# Patient Record
Sex: Male | Born: 1960 | Race: White | Hispanic: No | Smoking: Current some day smoker
Health system: Southern US, Community
[De-identification: ages and names within clinical notes are randomized; demographics above are authoritative.]

## PROBLEM LIST (undated history)

## (undated) DIAGNOSIS — M1990 Unspecified osteoarthritis, unspecified site: Secondary | ICD-10-CM

## (undated) HISTORY — DX: Unspecified osteoarthritis, unspecified site: M19.90

## (undated) HISTORY — PX: EYE SURGERY: SHX253

---

## 2002-04-30 ENCOUNTER — Emergency Department (HOSPITAL_COMMUNITY): Admission: EM | Admit: 2002-04-30 | Discharge: 2002-05-01 | Payer: Self-pay | Admitting: Emergency Medicine

## 2002-04-30 ENCOUNTER — Encounter: Payer: Self-pay | Admitting: Emergency Medicine

## 2004-10-28 ENCOUNTER — Ambulatory Visit (HOSPITAL_COMMUNITY): Admission: RE | Admit: 2004-10-28 | Discharge: 2004-10-28 | Payer: Self-pay | Admitting: Gastroenterology

## 2008-09-11 ENCOUNTER — Emergency Department (HOSPITAL_COMMUNITY): Admission: EM | Admit: 2008-09-11 | Discharge: 2008-09-11 | Payer: Self-pay | Admitting: Emergency Medicine

## 2010-07-10 NOTE — Op Note (Signed)
NAME:  [name] [name]                ACCOUNT NO.:  [id number]   MEDICAL RECORD NO.:  [id number]          PATIENT TYPE:  AMB   LOCATION:  ENDO                         FACILITY:  [facility]   PHYSICIAN:  James L. Malon Kindle., M.D.DATE OF BIRTH:  29-Nov-1960   DATE OF PROCEDURE:  10/28/2004  DATE OF DISCHARGE:                                 OPERATIVE REPORT   PROCEDURE:  Colonoscopy.   MEDICATIONS:  Fentanyl 125 mcg, Versed __________ throughout the procedure.   SCOPE:  Pediatric adjustable scope.   INDICATIONS:  Heme-positive stool.   DESCRIPTION OF PROCEDURE:  The procedure had been explained to the patient  and consent obtained.  In the left lateral decubitus position digital exam  was performed and the pediatric adjustable scope was inserted.  The scope  was then advanced.  The prep was adequate, some areas were obscured by  sticky, adherent stool but we were able to reach the cecum where the  ileocecal valve and appendiceal orifice were seen.  The scope was withdrawn  and the colon carefully examined upon withdrawal.  The mucosal pattern was  normal throughout with no ulceration, inflammation or evidence of any active  colitis.  There was no diverticular disease and no polyps or other  significant lesions.  The scope was withdrawn in the rectum and the rectum  was irrigated vigorously and was free of any obvious lesions.  The scope was  withdrawn and the patient tolerated the procedure well.   ASSESSMENT:  Heme-positive stool, negative colonoscopy, __________ .   PLAN:  We have the patient avoid nonsteroidal drugs and follow up with Dr.  Toma Aran of [facility].           ______________________________  Llana Aliment. Malon Kindle., M.D.     [name]  D:  10/28/2004  T:  10/28/2004  Job:  [id number]   cc:   Jari Pigg, M.D.

## 2010-07-10 NOTE — Op Note (Signed)
NAME:  [name] [name], [name] [name]                ACCOUNT NO.:  [id number]   MEDICAL RECORD NO.:  [id number]          PATIENT TYPE:  AMB   LOCATION:  ENDO                         FACILITY:  [facility]   PHYSICIAN:  James L. Malon Kindle., M.D.DATE OF BIRTH:  07-Apr-1960   DATE OF PROCEDURE:  10/28/2004  DATE OF DISCHARGE:                                 OPERATIVE REPORT   PROCEDURE PERFORMED:  Esophagogastroduodenoscopy.   MEDICATIONS:  Fentanyl 100 mcg, Versed 10 mg IV.   ENDOSCOPIST:  Llana Aliment. Randa Evens, M.D.   INDICATIONS FOR PROCEDURE:  Heme positive stool.   DESCRIPTION OF PROCEDURE:  The procedure had been explained to the patient  and consent obtained.  With the patient in left lateral decubitus position,  the Olympus scope was inserted and advanced with agglutination.  The  esophagus was entered.  The stomach was entered.  The pylorus identified and  passed.  The duodenum including the bulb and second portion were seen well  and were unremarkable.  The scope was withdrawn back into the stomach.  The  pyloric channel, antrum and body were seen well and were normal.  Fundus and  cardia were seen well on the retroflex view and were normal.  The distal and  proximal esophagus were endoscopically normal.  The scope was withdrawn.  The patient tolerated the procedure well.   ASSESSMENT:  Heme positive stool with negative endoscopy, 719.1.   PLAN:  Will proceed at this time with colonoscopy.           ______________________________  Llana Aliment. Malon Kindle., M.D.     [name]  D:  10/28/2004  T:  10/28/2004  Job:  [id number]   cc:   [name]

## 2013-10-02 ENCOUNTER — Other Ambulatory Visit: Payer: Self-pay | Admitting: Thoracic Surgery (Cardiothoracic Vascular Surgery)

## 2013-10-02 ENCOUNTER — Encounter: Payer: Self-pay | Admitting: Thoracic Surgery (Cardiothoracic Vascular Surgery)

## 2013-10-02 ENCOUNTER — Institutional Professional Consult (permissible substitution) (INDEPENDENT_AMBULATORY_CARE_PROVIDER_SITE_OTHER): Payer: PRIVATE HEALTH INSURANCE | Admitting: Thoracic Surgery (Cardiothoracic Vascular Surgery)

## 2013-10-02 ENCOUNTER — Ambulatory Visit
Admission: RE | Admit: 2013-10-02 | Discharge: 2013-10-02 | Disposition: A | Discharge status: Home / Self Care | Payer: PRIVATE HEALTH INSURANCE | Source: Ambulatory Visit | Attending: Thoracic Surgery (Cardiothoracic Vascular Surgery) | Admitting: Thoracic Surgery (Cardiothoracic Vascular Surgery)

## 2013-10-02 VITALS — BP 113/76 | HR 74 | Resp 16 | Ht 71.5 in | Wt 201.0 lb

## 2013-10-02 DIAGNOSIS — S2231XA Fracture of one rib, right side, initial encounter for closed fracture: Secondary | ICD-10-CM

## 2013-10-02 DIAGNOSIS — J90 Pleural effusion, not elsewhere classified: Secondary | ICD-10-CM

## 2013-10-02 DIAGNOSIS — S2239XA Fracture of one rib, unspecified side, initial encounter for closed fracture: Secondary | ICD-10-CM

## 2013-10-02 NOTE — Progress Notes (Signed)
PCP is No primary provider on file. Referring Provider is No ref. provider found  Chief Complaint  Patient presents with  . NEW THORACIC      NP/CLOSED MULTIPLE RIGHT RIB FX AFTER TRAUMA...fell 5 feet hitting right side of his chest on a pipe...happened 09/29/13    HPI: 53 yo man for consultation regarding right rib fractures.  [name] [name] is a 53 year old gentleman who fell off proximally 5 feet onto a metal pipe on Thursday (09/27/2013). He had pain in his right rib cage and difficulty taking a deep breath. He was seen in urgent care in [place] and told he had a rib fracture. He was given prescriptions for hydrocodone and Flexeril.  He followed up with an urgent care here. A repeat chest x-ray today shows a right posterior 8th rib fracture. There are small lateral off fractures in the seventh eighth and ninth ribs on the right. There is a small right pleural effusion. There is no pneumothorax   Past Medical History  Diagnosis Date  . Arthritis     NECK AND SHOULDERS    Past Surgical History  Procedure Laterality Date  . Eye surgery      53 YRS OLD    Family History  Problem Relation Age of Onset  . Cancer Father     LUNG    Social History History  Substance Use Topics  . Smoking status: Current Every Day Smoker -- 1.00 packs/day for 20 years    Types: Cigarettes  . Smokeless tobacco: Current User    Types: Snuff  . Alcohol Use: Yes     Comment: COUPLE OF BEERS A WEEK    Current Outpatient Prescriptions  Medication Sig Dispense Refill  . cyclobenzaprine (FLEXERIL) 10 MG tablet Take 10 mg by mouth 3 (three) times daily as needed for muscle spasms.      Marland Kitchen HYDROcodone-acetaminophen (NORCO) 7.5-325 MG per tablet Take 1 tablet by mouth every 6 (six) hours as needed for moderate pain.       No current facility-administered medications for this visit.    No Known Allergies  Review of Systems  Constitutional: Positive for unexpected weight change (has lost 50 pounds  intentionally).  Respiratory: Positive for cough.        Right chest wall pain  All other systems reviewed and are negative.   BP 113/76  Pulse 74  Resp 16  Ht 5' 11.5" (1.816 m)  Wt 201 lb (91.173 kg)  BMI 27.65 kg/m2  SpO2 97% Physical Exam  Vitals reviewed. Constitutional: He is oriented to person, place, and time. He appears well-developed and well-nourished. He appears distressed (obvious discomfort with movement).  HENT:  Head: Normocephalic and atraumatic.  Eyes: EOM are normal. Pupils are equal, round, and reactive to light.  Neck: Neck supple. No tracheal deviation present.  Cardiovascular: Normal rate, regular rhythm and normal heart sounds.   Pulmonary/Chest:  Ecchymosis and Bord abrasions right flank Decreased BS at right base  Abdominal: Soft. There is no tenderness.  Neurological: He is alert and oriented to person, place, and time. No cranial nerve deficit.  Skin: Skin is warm and dry.     Diagnostic Tests: Chest x-ray 10/02/2013 Posterior fracture right eighth rib, lateral fractures right seventh, eighth and ninth ribs. Small right pleural effusion  Impression: 53 year old gentleman who suffered a fall into his right chest. He has multiple rib fractures. He is experiencing great deal of pain with this as is typically the case. He is getting some relief  with Norco and Flexeril. The fractures are nondisplaced and he does not have a flail chest. He does have a very small hemothorax. That does not warrant intervention.  I explained to him that rest and pain control of the mainstays of treatment. He may try using an ice pack to the side as well as that may help some. I also advised him that nonsteroidal anti-inflammatories we'll likely work better in the long-term although I do think that he will need to continue to use the Norco for now.  He was advised to avoid any heavy lifting for a least a month. He was also advised he may continue to have pain in that area for  several months after that there  Plan: I will be happy see [name] [name] back if I can be of any further assistance with his care.

## 2015-06-04 ENCOUNTER — Encounter (HOSPITAL_COMMUNITY): Payer: Self-pay | Admitting: *Deleted

## 2015-06-04 ENCOUNTER — Emergency Department (HOSPITAL_COMMUNITY): Payer: PRIVATE HEALTH INSURANCE

## 2015-06-04 ENCOUNTER — Emergency Department (HOSPITAL_COMMUNITY)
Admission: EM | Admit: 2015-06-04 | Discharge: 2015-06-04 | Disposition: A | Discharge status: Home / Self Care | Payer: PRIVATE HEALTH INSURANCE | Attending: Emergency Medicine | Admitting: Emergency Medicine

## 2015-06-04 DIAGNOSIS — M7042 Prepatellar bursitis, left knee: Secondary | ICD-10-CM

## 2015-06-04 DIAGNOSIS — Y9389 Activity, other specified: Secondary | ICD-10-CM | POA: Insufficient documentation

## 2015-06-04 DIAGNOSIS — M25512 Pain in left shoulder: Secondary | ICD-10-CM | POA: Diagnosis not present

## 2015-06-04 DIAGNOSIS — M25462 Effusion, left knee: Secondary | ICD-10-CM | POA: Diagnosis present

## 2015-06-04 DIAGNOSIS — M1990 Unspecified osteoarthritis, unspecified site: Secondary | ICD-10-CM | POA: Diagnosis not present

## 2015-06-04 DIAGNOSIS — F17210 Nicotine dependence, cigarettes, uncomplicated: Secondary | ICD-10-CM | POA: Insufficient documentation

## 2015-06-04 MED ORDER — HYDROCODONE-ACETAMINOPHEN 5-325 MG PO TABS
1.0000 | ORAL_TABLET | Freq: Once | ORAL | Status: AC
  Administered 2015-06-04: 1 via ORAL
  Filled 2015-06-04: qty 1

## 2015-06-04 MED ORDER — NAPROXEN 500 MG PO TABS: 500.0000 mg | ORAL_TABLET | Freq: Two times a day (BID) | ORAL | Status: DC

## 2015-06-04 NOTE — ED Notes (Signed)
PT reports knee swelling to LT side has been present for 1 month and LT shoulder pain started on yesterday. 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

## 2015-06-04 NOTE — ED Notes (Signed)
Declined W/C at D/C and was escorted to lobby by RN. 

## 2015-06-04 NOTE — Discharge Instructions (Signed)
Prepatellar Bursitis With Rehab  Bursitis is a condition that is characterized by inflammation of a bursa. Saunders Revel exists in many areas of the body. They are fluid-filled sacs that lie between a soft tissue (skin, tendon, or ligament) and a bone, and they reduce friction between the structures as well as the stress placed on the soft tissue. Prepatellar bursitis is inflammation of the bursa that lies between the skin and the kneecap (patella). This condition often causes pain over the patella. SYMPTOMS   Pain, tenderness, and/or inflammation over the patella.  Pain that worsens with movement of the knee joint.  Decreased range of motion for the knee joint.  A crackling sound (crepitation) when the bursa is moved or touched.  Occasionally, painless swelling of the bursa.  Fever (when infected). CAUSES  Bursitis is caused by damage to the bursa, which results in an inflammatory response. Common mechanisms of injury include:  Direct trauma to the front of the knee.  Repetitive and/or stressful use of the knee. RISK INCREASES WITH:  Activities in which kneeling and/or falling on one's knees is likely (volleyball or football).  Repetitive and stressful training, especially if it involves running on hills.  Improper training techniques, such as a sudden increase in the intensity, frequency, or duration of training.  Failure to warm up properly before activity.  Poor technique.  Artificial turf. PREVENTION   Avoid kneeling or falling on your knees.  Warm up and stretch properly before activity.  Allow for adequate recovery between workouts.  Maintain physical fitness:  Strength, flexibility, and endurance.  Cardiovascular fitness.  Learn and use proper technique. When possible, have a coach correct improper technique.  Wear properly fitted and padded protective equipment (knee pads). PROGNOSIS  If treated properly, then the symptoms of prepatellar bursitis usually resolve  within 2 weeks. RELATED COMPLICATIONS   Recurrent symptoms that result in a chronic problem.  Prolonged healing time, if improperly treated or reinjured.  Limited range of motion.  Infection of bursa.  Chronic inflammation or scarring of bursa. TREATMENT  Treatment initially involves the use of ice and medication to help reduce pain and inflammation. The use of strengthening and stretching exercises may help reduce pain with activity, especially those of the quadriceps and hamstring muscles. These exercises may be performed at home or with referral to a therapist. Your caregiver may recommend knee pads when you return to playing sports, in order to reduce the stress on the prepatellar bursa. If symptoms persist despite treatment, then your caregiver may drain fluid out with a needle (aspirate) the bursa. If symptoms persist for greater than 6 months despite nonsurgical (conservative) treatment, then surgery may be recommended to remove the bursa.  MEDICATION  If pain medication is necessary, then nonsteroidal anti-inflammatory medications, such as aspirin and ibuprofen, or other Konczal pain relievers, such as acetaminophen, are often recommended.  Do not take pain medication for 7 days before surgery.  Prescription pain relievers may be given if deemed necessary by your caregiver. Use only as directed and only as much as you need.  Corticosteroid injections may be given by your caregiver. These injections should be reserved for the most serious cases, because they may only be given a certain number of times. HEAT AND COLD  Cold treatment (icing) relieves pain and reduces inflammation. Cold treatment should be applied for 10 to 15 minutes every 2 to 3 hours for inflammation and pain and immediately after any activity that aggravates your symptoms. Use ice packs or massage the  area with a piece of ice (ice massage). °· Heat treatment may be used prior to performing the stretching and  strengthening activities prescribed by your caregiver, physical therapist, or athletic trainer. Use a heat pack or soak the injury in warm water. °SEEK MEDICAL CARE IF: °· Treatment seems to offer no benefit, or the condition worsens. °· Any medications produce adverse side effects. °EXERCISES °RANGE OF MOTION (ROM) AND STRETCHING EXERCISES - Prepatellar Bursitis °These exercises may help you when beginning to rehabilitate your injury. Your symptoms may resolve with or without further involvement from your physician, physical therapist or athletic trainer. While completing these exercises, remember:  °· Restoring tissue flexibility helps normal motion to return to the joints. This allows healthier, less painful movement and activity. °· An effective stretch should be held for at least 30 seconds. °· A stretch should never be painful. You should only feel a gentle lengthening or release in the stretched tissue. °STRETCH - Hamstrings, Standing °· Stand or sit and extend your right / left leg, placing your foot on a chair or foot stool °· Keeping a slight arch in your low back and your hips straight forward. °· Lead with your chest and lean forward at the waist until you feel a gentle stretch in the back of your right / left knee or thigh. (When done correctly, this exercise requires leaning only a small distance.) °· Hold this position for __________ seconds. °Repeat __________ times. Complete this stretch __________ times per day. °STRETCH - Quadriceps, Prone  °· Lie on your stomach on a firm surface, such as a bed or padded floor. °· Bend your right / left knee and grasp your ankle. If you are unable to reach, your ankle or pant leg, use a belt around your foot to lengthen your reach. °· Gently pull your heel toward your buttocks. Your knee should not slide out to the side. You should feel a stretch in the front of your thigh and/or knee. °· Hold this position for __________ seconds. °Repeat __________ times.  Complete this stretch __________ times per day.  °STRETCH - Hamstrings/Adductors, V-Sit  °· Sit on the floor with your legs extended in a large "V," keeping your knees straight. °· With your head and chest upright, bend at your waist reaching for your right foot to stretch your left adductors. °· You should feel a stretch in your left inner thigh. Hold for __________ seconds. °· Return to the upright position to relax your leg muscles. °· Continuing to keep your chest upright, bend straight forward at your waist to stretch your hamstrings. °· You should feel a stretch behind both of your thighs and/or knees. Hold for __________ seconds. °· Return to the upright position to relax your leg muscles. °· Repeat steps 2 through 4. °Repeat __________ times. Complete this exercise __________ times per day.  °STRENGTHENING EXERCISES - Prepatellar Bursitis °· These exercises may help you when beginning to rehabilitate your injury. They may resolve your symptoms with or without further involvement from your physician, physical therapist or athletic trainer. While completing these exercises, remember: °· Muscles can gain both the endurance and the strength needed for everyday activities through controlled exercises. °· Complete these exercises as instructed by your physician, physical therapist or athletic trainer. Progress the resistance and repetitions only as guided. °STRENGTH - Quadriceps, Isometrics °· Lie on your back with your right / left leg extended and your opposite knee bent. °· Gradually tense the muscles in the front of your right /   left thigh. You should see either your kneecap slide up toward your hip or increased dimpling just above the knee. This motion will push the back of the knee down toward the floor/mat/bed on which you are lying.  Hold the muscle as tight as you can without increasing your pain for __________ seconds.  Relax the muscles slowly and completely in between each repetition. Repeat  __________ times. Complete this exercise __________ times per day.  STRENGTH - Quadriceps, Short Arcs   Lie on your back. Place a __________ inch towel roll under your knee so that the knee slightly bends.  Raise only your lower leg by tightening the muscles in the front of your thigh. Do not allow your thigh to rise.  Hold this position for __________ seconds. Repeat __________ times. Complete this exercise __________ times per day.  OPTIONAL ANKLE WEIGHTS: Begin with ____________________, but DO NOT exceed ____________________. Increase in1 lb/0.5 kg increments.  STRENGTH - Quadriceps, Straight Leg Raises  Quality counts! Watch for signs that the quadriceps muscle is working to insure you are strengthening the correct muscles and not "cheating" by substituting with healthier muscles.  Lay on your back with your right / left leg extended and your opposite knee bent.  Tense the muscles in the front of your right / left thigh. You should see either your kneecap slide up or increased dimpling just above the knee. Your thigh may even quiver.  Tighten these muscles even more and raise your leg 4 to 6 inches off the floor. Hold for __________ seconds.  Keeping these muscles tense, lower your leg.  Relax the muscles slowly and completely in between each repetition. Repeat __________ times. Complete this exercise __________ times per day.  STRENGTH - Quadriceps, Step-Ups   Use a thick book, step or step stool that is __________ inches tall.  Holding a wall or counter for balance only, not support.  Slowly step-up with your right / left foot, keeping your knee in line with your hip and foot. Do not allow your knee to bend so far that you cannot see your toes.  Slowly unlock your knee and lower yourself to the starting position. Your muscles, not gravity, should lower you. Shoulder Pain The shoulder is the joint that connects your arm to your body. Muscles and band-like tissues that connect  bones to muscles (tendons) hold the joint together. Shoulder pain is felt if an injury or medical problem affects one or more parts of the shoulder. HOME CARE  Put ice on the sore area. Put ice in a plastic bag. Place a towel between your skin and the bag. Leave the ice on for 15-20 minutes, 03-04 times a day for the first 2 days. Stop using cold packs if they do not help with the pain. If you were given something to keep your shoulder from moving (sling; shoulder immobilizer), wear it as told. Only take it off to shower or bathe. Move your arm as little as possible, but keep your hand moving to prevent puffiness (swelling). Squeeze a soft ball or foam pad as much as possible to help prevent swelling. Take medicine as told by your doctor. GET HELP IF: You have progressing new pain in your arm, hand, or fingers. Your hand or fingers get cold. Your medicine does not help lessen your pain. GET HELP RIGHT AWAY IF:  Your arm, hand, or fingers are numb or tingling. Your arm, hand, or fingers are puffy (swollen), painful, or turn white or blue. MAKE  SURE YOU:  Understand these instructions. Will watch your condition. Will get help right away if you are not doing well or get worse.   This information is not intended to replace advice given to you by your health care provider. Make sure you discuss any questions you have with your health care provider.   Follow-up with orthopedic provider if your symptoms do not improve. Avoid repeat trauma to your knee. Wear knee brace or kneepads while at work. The swelling in your knee should improve over the next month. If for any reason your knee becomes red, swollen or hot to the touch and you have pain with knee movement you'll need to return to the ER. Take Naprosyn for pain and inflammation. Keep left arm in sling as much as possible. Apply ice to affected area.

## 2015-06-04 NOTE — ED Provider Notes (Signed)
CSN: [id number]     Arrival date & time 06/04/15  1116 History  By signing my name below, I, Emmanuella Mensah, attest that this documentation has been prepared under the direction and in the presence of AvayaSamantha Tudor Chandley, PA-C. Electronically Signed: Angelene GiovanniEmmanuella Mensah, ED Scribe. 06/04/2015. 12:55 PM.      Chief Complaint  Patient presents with  . Joint Swelling  . Shoulder Pain   The history is provided by the patient. No language interpreter was used.   HPI Comments: [name] [name] is a 55 y.o. male who presents to the Emergency Department complaining of gradually worsening moderate pain and swelling to the left knee onset 2 weeks ago.  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 He also reports left shoulder pain onset several days ago. He reports pain with ROM of the left shoulder. He denies any recent falls, injuries, or trauma but states that he was playing water guns with his grandsons yesterday which increased his pain. He states that he tried Ibuprofen with no relief. He denies any fever, chills, or open wounds.    Past Medical History  Diagnosis Date  . Arthritis     NECK AND SHOULDERS   Past Surgical History  Procedure Laterality Date  . Eye surgery      55 YRS OLD   Family History  Problem Relation Age of Onset  . Cancer Father     LUNG   Social History  Substance Use Topics  . Smoking status: Current Every Day Smoker -- 1.00 packs/day for 20 years    Types: Cigarettes  . Smokeless tobacco: Current User    Types: Snuff  . Alcohol Use: Yes     Comment: COUPLE OF BEERS A WEEK    Review of Systems  Constitutional: Negative for fever and chills.  Musculoskeletal: Positive for joint swelling and arthralgias (left knee and left shoulder).  Skin: Negative for rash and wound.      Allergies  Review of patient's allergies indicates no known allergies.  Home Medications   Prior to Admission medications    Medication Sig Start Date End Date Taking? Authorizing Provider  cyclobenzaprine (FLEXERIL) 10 MG tablet Take 10 mg by mouth 3 (three) times daily as needed for muscle spasms.    Historical Provider, MD  HYDROcodone-acetaminophen (NORCO) 7.5-325 MG per tablet Take 1 tablet by mouth every 6 (six) hours as needed for moderate pain.    Historical Provider, MD   BP 133/91 mmHg  Pulse 81  Temp(Src) 98.1 F (36.7 C) (Oral)  Resp 20  SpO2 96% Physical Exam  Constitutional: He is oriented to person, place, and time. He appears well-developed and well-nourished. No distress.  HENT:  Head: Normocephalic and atraumatic.  Eyes: Conjunctivae are normal. Right eye exhibits no discharge. Left eye exhibits no discharge. No scleral icterus.  Cardiovascular: Normal rate.   Pulmonary/Chest: Effort normal.  Musculoskeletal:  Large fluid collection on anterior aspect of left patella, no overlying erythema, no pain with flexion or extension. Popliteal pulses present Negative posterior and anterior drawer  Shoulder: TTP over AC joint, positive neer test. Pain with abduction and limited ROM due to pain. No obvious bony deformities.   Neurological: He is alert and oriented to person, place, and time. Coordination normal.  Skin: Skin is warm and dry. No rash noted. He is not diaphoretic. No erythema. No pallor.  Psychiatric: He has a normal mood and affect.  His behavior is normal.  Nursing note and vitals reviewed.   ED Course  Procedures (including critical care time) DIAGNOSTIC STUDIES: Oxygen Saturation is 96% on RA, normal by my interpretation.    COORDINATION OF CARE: 12:20 PM- Pt advised of plan for treatment and pt agrees. Pt will receive x-ray for further evaluation.   Imaging Review Dg Shoulder Left  06/04/2015  CLINICAL DATA:  Left shoulder pain for 1 day.  No known injury. EXAM: LEFT SHOULDER - 2+ VIEW COMPARISON:  None. FINDINGS: The mineralization and alignment are normal. There is no  evidence of acute fracture or dislocation. The subacromial space is preserved. There are mild glenohumeral and acromioclavicular degenerative changes. IMPRESSION: No acute osseous findings. Electronically Signed   By: Carey Bullocks M.D.   On: 06/04/2015 13:36   Dg Knee Complete 4 Views Left  06/04/2015  CLINICAL DATA:  Probable prepatellar bursitis. Exclude joint effusion. EXAM: LEFT KNEE - COMPLETE 4+ VIEW COMPARISON:  None. FINDINGS: Prepatellar swelling compatible with history of bursitis. No evidence of joint effusion as permitted by mild lateral obliquity. Mild degenerative marginal spurring. No soft tissue calcification or erosion. IMPRESSION: Prepatellar swelling compatible with bursitis. No evidence of joint effusion. Electronically Signed   By: Marnee Spring M.D.   On: 06/04/2015 13:19     Gaylyn Rong, PA-C has personally reviewed and evaluated these images as part of her medical decision-making.  MDM   Final diagnoses:  Prepatellar bursitis of left knee  Left shoulder pain   Exam and hx c/w prepatellar bursitis. Xray also states that results are compatible with bursitis. NO evidence of joint effusion. Bursitis likely due to repetitive trauma from working in crawl spaces and being on his knees. Pt given brace. Recommend padding knee while working, antiinflammatories. Discussed that this condition is typically self-limiting. Follow up with ortho if symptoms do not improve in 1 month. RICE protocol also recommended. Shoulder xray negative for acute fx. No sign of rotator cuff tear or AC joint separation. Pt placed in sling. RICE protocol recommended. Pt may follow up with ortho as needed. Pain managed in ED. Return precautions outlined in patient discharge instructions.    I personally performed the services described in this documentation, which was scribed in my presence. The recorded information has been reviewed and is accurate.    Lester Kinsman [place], PA-C 06/06/15  1022  Gwyneth Sprout, MD 06/06/15 [phone]

## 2017-08-18 ENCOUNTER — Encounter (HOSPITAL_COMMUNITY): Payer: Self-pay

## 2017-08-18 ENCOUNTER — Other Ambulatory Visit: Payer: Self-pay

## 2017-08-18 ENCOUNTER — Emergency Department (HOSPITAL_COMMUNITY)
Admission: EM | Admit: 2017-08-18 | Discharge: 2017-08-18 | Disposition: A | Discharge status: Home / Self Care | Payer: BLUE CROSS/BLUE SHIELD | Attending: Emergency Medicine | Admitting: Emergency Medicine

## 2017-08-18 ENCOUNTER — Emergency Department (HOSPITAL_BASED_OUTPATIENT_CLINIC_OR_DEPARTMENT_OTHER): Payer: BLUE CROSS/BLUE SHIELD

## 2017-08-18 ENCOUNTER — Emergency Department (HOSPITAL_COMMUNITY): Payer: BLUE CROSS/BLUE SHIELD

## 2017-08-18 DIAGNOSIS — F17210 Nicotine dependence, cigarettes, uncomplicated: Secondary | ICD-10-CM | POA: Insufficient documentation

## 2017-08-18 DIAGNOSIS — M79609 Pain in unspecified limb: Secondary | ICD-10-CM | POA: Diagnosis not present

## 2017-08-18 DIAGNOSIS — Z79899 Other long term (current) drug therapy: Secondary | ICD-10-CM | POA: Diagnosis not present

## 2017-08-18 DIAGNOSIS — M79605 Pain in left leg: Secondary | ICD-10-CM | POA: Diagnosis present

## 2017-08-18 DIAGNOSIS — M5432 Sciatica, left side: Secondary | ICD-10-CM | POA: Diagnosis not present

## 2017-08-18 LAB — COMPREHENSIVE METABOLIC PANEL
ALBUMIN: 3.7 g/dL (ref 3.5–5.0)
ALK PHOS: 73 U/L (ref 38–126)
ALT: 21 U/L (ref 0–44)
ANION GAP: 11 (ref 5–15)
AST: 19 U/L (ref 15–41)
BILIRUBIN TOTAL: 0.9 mg/dL (ref 0.3–1.2)
BUN: 13 mg/dL (ref 6–20)
CALCIUM: 8.9 mg/dL (ref 8.9–10.3)
CO2: 26 mmol/L (ref 22–32)
CREATININE: 0.81 mg/dL (ref 0.61–1.24)
Chloride: 102 mmol/L (ref 98–111)
GFR calc Af Amer: >60 mL/min (ref >60)
GFR calc non Af Amer: >60 mL/min (ref >60)
GLUCOSE: 103 mg/dL — AB (ref 70–99)
Potassium: 3.9 mmol/L (ref 3.5–5.1)
Sodium: 139 mmol/L (ref 135–145)
Total Protein: 6.7 g/dL (ref 6.5–8.1)

## 2017-08-18 LAB — CBC WITH DIFFERENTIAL/PLATELET
ABS IMMATURE GRANULOCYTES: 0 10*3/uL (ref 0.0–0.1)
BASOS ABS: 0.1 10*3/uL (ref 0.0–0.1)
Basophils Relative: 1 %
Eosinophils Absolute: 0.5 10*3/uL (ref 0.0–0.7)
Eosinophils Relative: 5 %
HEMATOCRIT: 49.9 % (ref 39.0–52.0)
HEMOGLOBIN: 16 g/dL (ref 13.0–17.0)
Immature Granulocytes: 0 %
LYMPHS ABS: 2.1 10*3/uL (ref 0.7–4.0)
LYMPHS PCT: 19 %
MCH: 29.2 pg (ref 26.0–34.0)
MCHC: 32.1 g/dL (ref 30.0–36.0)
MCV: 91.1 fL (ref 78.0–100.0)
Monocytes Absolute: 0.8 10*3/uL (ref 0.1–1.0)
Monocytes Relative: 7 %
NEUTROS ABS: 7.7 10*3/uL (ref 1.7–7.7)
Neutrophils Relative %: 68 %
Platelets: 238 10*3/uL (ref 150–400)
RBC: 5.48 MIL/uL (ref 4.22–5.81)
RDW: 12.9 % (ref 11.5–15.5)
WBC: 11.2 10*3/uL — AB (ref 4.0–10.5)

## 2017-08-18 MED ORDER — ACETAMINOPHEN 500 MG PO TABS
1000.0000 mg | ORAL_TABLET | Freq: Once | ORAL | Status: AC
  Administered 2017-08-18: 1000 mg via ORAL
  Filled 2017-08-18: qty 2

## 2017-08-18 MED ORDER — TRAMADOL HCL 50 MG PO TABS
100.0000 mg | ORAL_TABLET | Freq: Once | ORAL | Status: AC
  Administered 2017-08-18: 100 mg via ORAL
  Filled 2017-08-18: qty 2

## 2017-08-18 MED ORDER — ORPHENADRINE CITRATE ER 100 MG PO TB12: 100.0000 mg | ORAL_TABLET | Freq: Two times a day (BID) | ORAL | 0 refills | Status: DC

## 2017-08-18 MED ORDER — DOCUSATE SODIUM 100 MG PO CAPS: 100.0000 mg | ORAL_CAPSULE | Freq: Two times a day (BID) | ORAL | 0 refills | Status: DC

## 2017-08-18 MED ORDER — HYDROCODONE-ACETAMINOPHEN 5-325 MG PO TABS: 1.0000 | ORAL_TABLET | ORAL | 0 refills | Status: DC | PRN

## 2017-08-18 MED ORDER — METHYLPREDNISOLONE 4 MG PO TBPK: ORAL_TABLET | ORAL | 0 refills | Status: DC

## 2017-08-18 NOTE — Progress Notes (Signed)
LLE venous duplex prelim: negative for DVT. Elsie Baynes Eunice, RDMS, RVT  

## 2017-08-18 NOTE — Discharge Instructions (Signed)
1.  Take the Medrol Dosepak as prescribed.  Take Norflex for muscle spasm.  Take acetaminophen every 6-8 hours for pain control.  If you need stronger pain medication, take 1-2 Vicodin every 6 hours.  You cannot take acetaminophen and Vicodin together, each Vicodin tablet contains the equivalent of 1 regular 325 mg strength acetaminophen. 2.  Make an appointment to follow-up with spine orthopedic doctor. 3.  You should also have a family doctor.  There is a referral number in your discharge instructions you may call to find one. 4.  Return to the emergency department if you develop weakness in the leg, difficulty walking, difficulty passing urine or other concerning symptoms.

## 2017-08-18 NOTE — ED Triage Notes (Signed)
Pt from home with leg pain that started in his back with a pulled muscle and has moved down his left leg. Pt states it hurts so bad it goes numb

## 2017-08-18 NOTE — ED Notes (Signed)
Pt aware of need for urine  

## 2017-08-18 NOTE — ED Provider Notes (Signed)
[facility] EMERGENCY DEPARTMENT Provider Note   CSN: [id number] Arrival date & time: 08/18/17  4098     History   Chief Complaint Chief Complaint  Patient presents with  . Leg Pain    left    HPI [name] [name] is a 57 y.o. male.  HPI Patient reports he strained his back about 2 weeks ago.  He was lifting some barrels that were not particularly heavy but he felt a pull in his left lower back.  He began applying topical creams for discomfort.  He reports however has gotten severely worse and now he has pain that radiates around into his left groin and medial thigh.  He feels a deep throbbing and aching in the groin and medial leg.  He reports that anterior surface of his thigh feels kind of numb.  He denies symptoms of the lower leg.  He denies numbness or weakness of the foot or lower leg.  He denies abdominal pain or pain or burning with urgency urination.  No fevers no chills.  Patient does not get routine medical care.  He reports he does keep an eye on his blood pressure and is usually normal.  He has no medical problems that he is aware of however he is not pursued any basic medical evaluation or care. Past Medical History:  Diagnosis Date  . Arthritis    NECK AND SHOULDERS    There are no active problems to display for this patient.   Past Surgical History:  Procedure Laterality Date  . EYE SURGERY     57 YRS OLD        Home Medications    Prior to Admission medications   Medication Sig Start Date End Date Taking? Authorizing Provider  ibuprofen (ADVIL) 200 MG tablet Take 800 mg by mouth every 6 (six) hours as needed for mild pain.   Yes [provider]  docusate sodium (COLACE) 100 MG capsule Take 1 capsule (100 mg total) by mouth every 12 (twelve) hours. 08/18/17   Arby Barrette, MD  HYDROcodone-acetaminophen (NORCO/VICODIN) 5-325 MG tablet Take 1-2 tablets by mouth every 4 (four) hours as needed for moderate pain or severe pain.  08/18/17   Arby Barrette, MD  methylPREDNISolone (MEDROL DOSEPAK) 4 MG TBPK tablet As per Hinda Kehr 08/18/17   Arby Barrette, MD  naproxen (NAPROSYN) 500 MG tablet Take 1 tablet (500 mg total) by mouth 2 (two) times daily. Patient not taking: Reported on 08/18/2017 06/04/15   Dowless, Lelon Mast Tripp, PA-C  orphenadrine (NORFLEX) 100 MG tablet Take 1 tablet (100 mg total) by mouth 2 (two) times daily. 08/18/17   Arby Barrette, MD    Family History Family History  Problem Relation Age of Onset  . Cancer Father        LUNG    Social History Social History   Tobacco Use  . Smoking status: Current Every Day Smoker    Packs/day: 1.00    Years: 20.00    Pack years: 20.00    Types: Cigarettes  . Smokeless tobacco: Current User    Types: Snuff  Substance Use Topics  . Alcohol use: Yes    Comment: COUPLE OF BEERS A WEEK  . Drug use: Not on file     Allergies   Coconut oil   Review of Systems Review of Systems 10 Systems reviewed and are negative for acute change except as noted in the HPI.   Physical Exam Updated Vital Signs BP (!) 138/96  Pulse 92   Temp 98.3 F (36.8 C) (Oral)   Resp 16   Ht 5' 11.5" (1.816 m)   Wt 102.1 kg (225 lb)   SpO2 91%   BMI 30.94 kg/m   Physical Exam  Constitutional: He is oriented to person, place, and time. He appears well-developed and well-nourished.  HENT:  Head: Normocephalic and atraumatic.  Eyes: EOM are normal.  Neck: Neck supple.  Cardiovascular: Normal rate, regular rhythm, normal heart sounds and intact distal pulses.  Pulmonary/Chest: Effort normal and breath sounds normal.  Abdominal: Soft. Bowel sounds are normal. He exhibits no distension. There is no tenderness. There is no guarding.  Patient does endorse discomfort in the groin on the left.  Also discomfort to the high medial thigh in the abductor region.  Musculoskeletal: Normal range of motion.  Patient has trace pretibial edema.  No significant edema at the ankles  or feet.  Patient has discomfort to palpation of the medial upper thigh on the left.  No palpable nodules or soft tissue abnormality.  Strength testing is intact for bilateral lower extremities.  Patient does have exacerbation of low back pain with straight leg raise on the left greater than 45 degrees.  Neurological: He is alert and oriented to person, place, and time. He has normal strength. Coordination normal. GCS eye subscore is 4. GCS verbal subscore is 5. GCS motor subscore is 6.  Skin: Skin is warm, dry and intact.  Psychiatric: He has a normal mood and affect.     ED Treatments / Results  Labs (all labs ordered are listed, but only abnormal results are displayed) Labs Reviewed  CBC WITH DIFFERENTIAL/PLATELET - Abnormal; Notable for the following components:      Result Value   WBC 11.2 (*)    All other components within normal limits  COMPREHENSIVE METABOLIC PANEL - Abnormal; Notable for the following components:   Glucose, Bld 103 (*)    All other components within normal limits  URINALYSIS, ROUTINE W REFLEX MICROSCOPIC    EKG None  Radiology Dg Lumbar Spine Complete  Result Date: 08/18/2017 CLINICAL DATA:  Neck pain.  Pole muscle. EXAM: LUMBAR SPINE - COMPLETE 4+ VIEW COMPARISON:  Chest x-ray 10/02/2013. FINDINGS: Degenerative changes lumbar spine with scoliosis concave left. No acute bony abnormality identified. No evidence of fracture. Stool noted throughout the colon. Pelvic calcifications consistent with phleboliths. IMPRESSION: Degenerative changes lumbar spine with scoliosis concave left. No acute abnormality identified. No evidence of fracture. Electronically Signed   By: Maisie Fus  Register   On: 08/18/2017 09:58    Procedures Procedures (including critical care time)  Medications Ordered in ED Medications  acetaminophen (TYLENOL) tablet 1,000 mg (1,000 mg Oral Given 08/18/17 0917)  traMADol (ULTRAM) tablet 100 mg (100 mg Oral Given 08/18/17 0917)     Initial  Impression / Assessment and Plan / ED Course  I have reviewed the triage vital signs and the nursing notes.  Pertinent labs & imaging results that were available during my care of the patient were reviewed by me and considered in my medical decision making (see chart for details).      Final Clinical Impressions(s) / ED Diagnoses   Final diagnoses:  Sciatica of left side   Patient presents with pain from the lower back and sciatic region into the groin and thigh.  There was an element of reproducible pain on the medial thigh and over the vascular bundle.  No palpable abnormalities.  Vascular ultrasound rules out DVT.  At  this time, plan will be for a Medrol Dosepak, Norflex and hydrocodone as needed.  Return precautions reviewed.  Patient is counseled on the importance of follow-up with orthopedics and establishing a primary care physician. ED Discharge Orders        Ordered    methylPREDNISolone (MEDROL DOSEPAK) 4 MG TBPK tablet     08/18/17 1042    orphenadrine (NORFLEX) 100 MG tablet  2 times daily     08/18/17 1042    HYDROcodone-acetaminophen (NORCO/VICODIN) 5-325 MG tablet  Every 4 hours PRN     08/18/17 1042    docusate sodium (COLACE) 100 MG capsule  Every 12 hours     08/18/17 1042       Arby BarrettePfeiffer, Landers Prajapati, MD 08/18/17 1048

## 2017-08-18 NOTE — ED Notes (Signed)
Patient transported to X-ray 

## 2018-09-30 ENCOUNTER — Encounter (HOSPITAL_COMMUNITY): Payer: Self-pay | Admitting: Emergency Medicine

## 2018-09-30 ENCOUNTER — Other Ambulatory Visit: Payer: Self-pay

## 2018-09-30 ENCOUNTER — Emergency Department (HOSPITAL_COMMUNITY)
Admission: EM | Admit: 2018-09-30 | Discharge: 2018-10-01 | Disposition: A | Discharge status: Home / Self Care | Payer: BC Managed Care – PPO | Attending: Emergency Medicine | Admitting: Emergency Medicine

## 2018-09-30 DIAGNOSIS — R2242 Localized swelling, mass and lump, left lower limb: Secondary | ICD-10-CM | POA: Diagnosis not present

## 2018-09-30 DIAGNOSIS — Z87891 Personal history of nicotine dependence: Secondary | ICD-10-CM | POA: Insufficient documentation

## 2018-09-30 DIAGNOSIS — Z79899 Other long term (current) drug therapy: Secondary | ICD-10-CM | POA: Diagnosis not present

## 2018-09-30 DIAGNOSIS — M25562 Pain in left knee: Secondary | ICD-10-CM | POA: Insufficient documentation

## 2018-09-30 LAB — CBG MONITORING, ED: Glucose-Capillary: 96 mg/dL (ref 70–99)

## 2018-09-30 MED ORDER — HYDROCODONE-ACETAMINOPHEN 5-325 MG PO TABS
1.0000 | ORAL_TABLET | Freq: Once | ORAL | Status: AC
  Administered 2018-09-30: 1 via ORAL
  Filled 2018-09-30: qty 1

## 2018-09-30 MED ORDER — LIDOCAINE-EPINEPHRINE (PF) 2 %-1:200000 IJ SOLN
20.0000 mL | Freq: Once | INTRAMUSCULAR | Status: AC
  Administered 2018-09-30: 20 mL via INTRADERMAL
  Filled 2018-09-30: qty 20

## 2018-09-30 NOTE — ED Triage Notes (Signed)
Pt reports copnstant left knee pain x 2 weeks, states it has gotten worse in the last 2 days, denies a known injury.  Reports he had a fluid build up on that knee that resolved on it's own 6 months ago. States he is on his feet a lot for work.

## 2018-09-30 NOTE — Discharge Instructions (Addendum)
Joint fluid did not show signs of infection.  This is likely inflammatory process. Follow-up with orthopedics.  Keep knee wrapped to help with swelling/pain. Start medications as prescribed. Return here for any new/acute changes.

## 2018-09-30 NOTE — ED Provider Notes (Signed)
[place] EMERGENCY DEPARTMENT Provider Note   CSN: [id number] Arrival date & time: 09/30/18  1837     History   Chief Complaint Chief Complaint  Patient presents with  . Knee Pain    HPI [name] [name] is a 58 y.o. male who presents with L knee pain and swelling. No significant PMH. He states that for the past 2 weeks he has had pain over the medial aspect of his L knee. It is constant and worse with palpation, movement of the knee, and walking. He works in [employer] and is on his feet for 12+ hours a day. Over the past 2 days he has had increasing swelling of the L knee. He has had similar symptoms before but it's always gone away. He does not have a doctor. He tried OTC meds without relief. He denies fever. He's been using a brace but it doesn't help much. He denies any injury to the knee or hx of knee injections. No hx of gout.     HPI  Past Medical History:  Diagnosis Date  . Arthritis    NECK AND SHOULDERS    There are no active problems to display for this patient.   Past Surgical History:  Procedure Laterality Date  . EYE SURGERY     [place] Medications    Prior to Admission medications   Medication Sig Start Date End Date Taking? Authorizing Provider  docusate sodium (COLACE) 100 MG capsule Take 1 capsule (100 mg total) by mouth every 12 (twelve) hours. 08/18/17   Charlesetta Shanks, MD  HYDROcodone-acetaminophen (NORCO/VICODIN) 5-325 MG tablet Take 1-2 tablets by mouth every 4 (four) hours as needed for moderate pain or severe pain. 08/18/17   Charlesetta Shanks, MD  ibuprofen (ADVIL) 200 MG tablet Take 800 mg by mouth every 6 (six) hours as needed for mild pain.    [provider]  methylPREDNISolone (MEDROL DOSEPAK) 4 MG TBPK tablet As per Simone Curia 08/18/17   Charlesetta Shanks, MD  naproxen (NAPROSYN) 500 MG tablet Take 1 tablet (500 mg total) by mouth 2 (two) times daily. Patient not taking: Reported on 08/18/2017  06/04/15   Dowless, Aldona Bar Tripp, PA-C  orphenadrine (NORFLEX) 100 MG tablet Take 1 tablet (100 mg total) by mouth 2 (two) times daily. 08/18/17   Charlesetta Shanks, MD    Family History Family History  Problem Relation Age of Onset  . Cancer Father        LUNG    Social History Social History   Tobacco Use  . Smoking status: Former Smoker    Packs/day: 1.00    Years: 20.00    Pack years: 20.00    Types: Cigarettes  . Smokeless tobacco: Current User    Types: Snuff  . Tobacco comment: 2 weeks ago quit   Substance Use Topics  . Alcohol use: Yes    Comment: COUPLE OF BEERS A WEEK  . Drug use: Not on file     Allergies   Coconut oil   Review of Systems Review of Systems  Constitutional: Negative for fever.  Musculoskeletal: Positive for arthralgias and joint swelling.     Physical Exam Updated Vital Signs BP (!) 139/100 (BP Location: Right Arm)   Pulse 75   Temp 98.2 F (36.8 C) (Oral)   Resp 17   SpO2 99%   Physical Exam Vitals signs and nursing note reviewed.  Constitutional:      General:  He is not in acute distress.    Appearance: Normal appearance. He is well-developed. He is not ill-appearing.  HENT:     Head: Normocephalic and atraumatic.  Eyes:     General: No scleral icterus.       Right eye: No discharge.        Left eye: No discharge.     Conjunctiva/sclera: Conjunctivae normal.     Pupils: Pupils are equal, round, and reactive to light.  Neck:     Musculoskeletal: Normal range of motion.  Cardiovascular:     Rate and Rhythm: Normal rate.  Pulmonary:     Effort: Pulmonary effort is normal. No respiratory distress.  Abdominal:     General: There is no distension.  Musculoskeletal:     Comments: L knee: Diffuse knee swelling. No significant redness. Tenderness to palpation over the medial aspect of the knee. Decreased ROM.    Skin:    General: Skin is warm and dry.  Neurological:     Mental Status: He is alert and oriented to person,  place, and time.  Psychiatric:        Behavior: Behavior normal.      ED Treatments / Results  Labs (all labs ordered are listed, but only abnormal results are displayed) Labs Reviewed  GRAM STAIN  CULTURE, BODY FLUID-BOTTLE  SYNOVIAL CELL COUNT + DIFF, W/ CRYSTALS  GLUCOSE, BODY FLUID OTHER  CBG MONITORING, ED    EKG None  Radiology No results found.  Procedures .Joint Aspiration/Arthrocentesis  Date/Time: 09/30/2018 11:13 PM Performed by: Bethel BornGekas, Sharnette Kitamura Marie, PA-C Authorized by: Bethel BornGekas, Jaleea Alesi Marie, PA-C   Consent:    Consent obtained:  Verbal   Consent given by:  Patient   Risks discussed:  Bleeding, infection, pain and incomplete drainage   Alternatives discussed:  No treatment Location:    Location:  Knee   Knee:  L knee Anesthesia (see MAR for exact dosages):    Anesthesia method:  Local infiltration   Local anesthetic:  Lidocaine 2% WITH epi Procedure details:    Preparation: Patient was prepped and draped in usual sterile fashion     Needle gauge:  18 G   Ultrasound guidance: yes     Approach:  Lateral   Aspirate amount:  40cc   Aspirate characteristics:  Serous and blood-tinged   Steroid injected: no     Specimen collected: no   Post-procedure details:    Dressing:  Adhesive bandage and sterile dressing   Patient tolerance of procedure:  Tolerated well, no immediate complications   (including critical care time)    Medications Ordered in ED Medications  lidocaine-EPINEPHrine (XYLOCAINE W/EPI) 2 %-1:200000 (PF) injection 20 mL (20 mLs Intradermal Given by Other 09/30/18 2248)  HYDROcodone-acetaminophen (NORCO/VICODIN) 5-325 MG per tablet 1 tablet (1 tablet Oral Given 09/30/18 2037)     Initial Impression / Assessment and Plan / ED Course  I have reviewed the triage vital signs and the nursing notes.  Pertinent labs & imaging results that were available during my care of the patient were reviewed by me and considered in my medical decision  making (see chart for details).  58 year old male presents with diffuse L knee swelling of unknown etiology. He has not had any trauma. He is hypertensive but otherwise vitals are normal. He has diffuse swelling with decreased ROM but no significant redness or tenderness. Exam is not consistent with a septic joint. Imaging was deferred today in the absence of injury. A knee arthrocentesis  was performed and 40cc of yellow tinged fluid was aspirated. At shift change the cell count is pending. Plan is to have pt elevate, ice, rest, and take NSAIDs and to f/u with orthopedics for outpatient MRI as long as cell counts are not consistent with a septic joint. Care signed out to [facility] PA-C  Final Clinical Impressions(s) / ED Diagnoses   Final diagnoses:  Acute pain of left knee    ED Discharge Orders    None       Beryle QuantGekas, Aeneas Longsworth Marie, PA-C 09/30/18 2359    Benjiman CorePickering, Nathan, MD 10/01/18 1246

## 2018-10-01 LAB — GRAM STAIN

## 2018-10-01 LAB — SYNOVIAL CELL COUNT + DIFF, W/ CRYSTALS
Crystals, Fluid: NONE SEEN
Lymphocytes-Synovial Fld: 10 % (ref 0–20)
Monocyte-Macrophage-Synovial Fluid: 68 % (ref 50–90)
Neutrophil, Synovial: 22 % (ref 0–25)
WBC, Synovial: 263 /mm3 — ABNORMAL HIGH (ref 0–200)

## 2018-10-01 MED ORDER — MELOXICAM 7.5 MG PO TABS: 15.0000 mg | ORAL_TABLET | Freq: Every day | ORAL | 0 refills | Status: DC

## 2018-10-01 NOTE — ED Notes (Signed)
Patient verbalizes understanding of discharge instructions. Opportunity for questioning and answers were provided. Armband removed by staff, pt discharged from ED ambulatory.   

## 2018-10-01 NOTE — ED Provider Notes (Signed)
Assumed care from PA Gekas at shift change.  See prior notes for full H&P.  Briefly, 58 y.o. M here with atraumatic left knee pain and swelling.  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  History of similar a few months ago that resolved without intervention.  Arthrocentesis was performed.  Plan:  Joint fluid pending, likely d/c with orthopedic follow-up.  Results for orders placed or performed during the hospital encounter of 09/30/18  Gram stain   Specimen: Synovium; Body Fluid  Result Value Ref Range   Specimen Description SYNOVIAL FLUID KNEE    Special Requests NONE    Gram Stain      FEW WBC PRESENT, PREDOMINANTLY MONONUCLEAR NO ORGANISMS SEEN RESULT CALLED TO, READ BACK BY AND VERIFIED WITH: STEPHEN TOWNS RN @ 0013 ON 10/01/18 BY ROBINSON Z.  Performed at [facility], [place] [address] [place], [place] [address]    Report Status PENDING   Cell count + diff,  w/ cryst-synvl fld  Result Value Ref Range   Color, Synovial AMBER (A) YELLOW   Appearance-Synovial CLOUDY (A) CLEAR   Crystals, Fluid NO CRYSTALS SEEN    WBC, Synovial 263 (H) 0 - 200 /cu mm   Neutrophil, Synovial 22 0 - 25 %   Lymphocytes-Synovial Fld 10 0 - 20 %   Monocyte-Macrophage-Synovial Fluid 68 50 - 90 %  CBG monitoring, ED  Result Value Ref Range   Glucose-Capillary 96 70 - 99 mg/dL   No results found.   Cell count 263 with no organisms seen on Gram stain.  Culture is pending.  This is likely an inflammatory process.  Ace wrap has been applied, will start Mobic.  Encourage orthopedic follow-up.  Can return here for any new or acute changes.   Larene Pickett, PA-C 94/17/40 8144    Delora Fuel, MD 81/85/63 [phone]

## 2018-10-02 LAB — GLUCOSE, BODY FLUID OTHER??????????: Glucose, Body Fluid Other: 92 mg/dL

## 2018-10-05 LAB — CULTURE, BODY FLUID W GRAM STAIN -BOTTLE
Culture: NO GROWTH
Special Requests: ADEQUATE

## 2018-10-05 LAB — CULTURE, BODY FLUID-BOTTLE

## 2018-10-17 ENCOUNTER — Other Ambulatory Visit: Payer: Self-pay | Admitting: Orthopedic Surgery

## 2018-10-17 DIAGNOSIS — M25462 Effusion, left knee: Secondary | ICD-10-CM

## 2018-11-05 ENCOUNTER — Other Ambulatory Visit: Payer: BC Managed Care – PPO

## 2018-11-17 ENCOUNTER — Other Ambulatory Visit: Payer: Self-pay | Admitting: Orthopedic Surgery

## 2018-11-19 ENCOUNTER — Ambulatory Visit
Admission: RE | Admit: 2018-11-19 | Discharge: 2018-11-19 | Disposition: A | Discharge status: Home / Self Care | Payer: BC Managed Care – PPO | Source: Ambulatory Visit | Attending: Orthopedic Surgery | Admitting: Orthopedic Surgery

## 2018-11-19 ENCOUNTER — Other Ambulatory Visit: Payer: Self-pay

## 2018-11-19 DIAGNOSIS — M25462 Effusion, left knee: Secondary | ICD-10-CM

## 2019-01-26 NOTE — Patient Instructions (Signed)
DUE TO COVID-19 ONLY ONE VISITOR IS ALLOWED TO COME WITH YOU AND STAY IN THE WAITING ROOM ONLY DURING PRE OP AND PROCEDURE DAY OF SURGERY. THE 1 VISITOR MAY VISIT WITH YOU AFTER SURGERY IN YOUR PRIVATE ROOM DURING VISITING HOURS ONLY!  YOU NEED TO HAVE A COVID 19 TEST ON_Friday 12/11/2020______ @_______ , THIS TEST MUST BE DONE BEFORE SURGERY, COME  [address] [place] , [id number].  ([place]) ONCE YOUR COVID TEST IS COMPLETED, PLEASE BEGIN THE QUARANTINE INSTRUCTIONS AS OUTLINED IN YOUR HANDOUT.                Asiel Chrostowski [place]     Your procedure is scheduled on: Tuesday 02/06/2019   Report to [facility] Main  Entrance    Report to Short Stay  at   [place]  AM     Call this number if you have problems the morning of surgery [phone]    Remember: Do not eat food  :After Midnight.     NO SOLID FOOD AFTER MIDNIGHT THE NIGHT PRIOR TO SURGERY. NOTHING BY MOUTH EXCEPT CLEAR LIQUIDS UNTIL  0430 am .     PLEASE FINISH ENSURE DRINK PER SURGEON ORDER  WHICH NEEDS TO BE COMPLETED AT 0430 am .   CLEAR LIQUID DIET   Foods Allowed                                                                     Foods Excluded  Coffee and tea, regular and decaf                             liquids that you cannot  Plain Jell-O any favor except red or purple                                           see through such as: Fruit ices (not with fruit pulp)                                     milk, soups, orange juice  Iced Popsicles                                    All solid food Carbonated beverages, regular and diet                                    Cranberry, grape and apple juices Sports drinks like Gatorade Lightly seasoned clear broth or consume(fat free) Sugar, honey syrup  Sample Menu Breakfast                                Lunch  Supper Cranberry juice                    Beef broth                            Chicken  broth Jell-O                                     Grape juice                           Apple juice Coffee or tea                        Jell-O                                      Popsicle                                                Coffee or tea                        Coffee or tea  _____________________________________________________________________      BRUSH YOUR TEETH MORNING OF SURGERY AND RINSE YOUR MOUTH OUT, NO CHEWING GUM CANDY OR MINTS.     Take these medicines the morning of surgery with A SIP OF WATER: none                                 You may not have any metal on your body including hair pins and              piercings  Do not wear jewelry, make-up, lotions, powders or perfumes, deodorant                         Men may shave face and neck.   Do not bring valuables to the hospital. [facility] IS NOT             RESPONSIBLE   FOR VALUABLES.  Contacts, dentures or bridgework may not be worn into surgery.  Leave suitcase in the car. After surgery it may be brought to your room.                   Please read over the following fact sheets you were given: _____________________________________________________________________             [facility] Pre[facility] Before surgery, you can play an important role.  Because skin is not sterile, your skin needs to be as free of germs as possible.  You can reduce the number of germs on your skin by washing with CHG (chlorahexidine gluconate) soap before surgery.  CHG is an antiseptic cleaner which kills germs and bonds with the skin to continue killing germs even after washing. Please DO NOT use if you have an allergy to CHG or antibacterial soaps.  If your skin becomes reddened/irritated stop using the CHG and inform your nurse  when you arrive at Short Stay. Do not shave (including legs and underarms) for at least 48 hours prior to the first CHG shower.  You may shave your face/neck. Please follow these  instructions carefully:  1.  Shower with CHG Soap the night before surgery and the  morning of Surgery.  2.  If you choose to wash your hair, wash your hair first as usual with your  normal  shampoo.  3.  After you shampoo, rinse your hair and body thoroughly to remove the  shampoo.                           4.  Use CHG as you would any other liquid soap.  You can apply chg directly  to the skin and wash                       Gently with a scrungie or clean washcloth.  5.  Apply the CHG Soap to your body ONLY FROM THE NECK DOWN.   Do not use on face/ open                           Wound or open sores. Avoid contact with eyes, ears mouth and genitals (private parts).                       Wash face,  Genitals (private parts) with your normal soap.             6.  Wash thoroughly, paying special attention to the area where your surgery  will be performed.  7.  Thoroughly rinse your body with warm water from the neck down.  8.  DO NOT shower/wash with your normal soap after using and rinsing off  the CHG Soap.                9.  Pat yourself dry with a clean towel.            10.  Wear clean pajamas.            11.  Place clean sheets on your bed the night of your first shower and do not  sleep with pets. Day of Surgery : Do not apply any lotions/deodorants the morning of surgery.  Please wear clean clothes to the hospital/surgery center.  FAILURE TO FOLLOW THESE INSTRUCTIONS MAY RESULT IN THE CANCELLATION OF YOUR SURGERY PATIENT SIGNATURE_________________________________  NURSE SIGNATURE__________________________________  ________________________________________________________________________   Rogelia Mire  An incentive spirometer is a tool that can help keep your lungs clear and active. This tool measures how well you are filling your lungs with each breath. Taking long deep breaths may help reverse or decrease the chance of developing breathing (pulmonary) problems (especially  infection) following:  A long period of time when you are unable to move or be active. BEFORE THE PROCEDURE   If the spirometer includes an indicator to show your best effort, your nurse or respiratory therapist will set it to a desired goal.  If possible, sit up straight or lean slightly forward. Try not to slouch.  Hold the incentive spirometer in an upright position. INSTRUCTIONS FOR USE  1. Sit on the edge of your bed if possible, or sit up as far as you can in bed or on a chair. 2. Hold the incentive spirometer in an upright position. 3. Breathe  out normally. 4. Place the mouthpiece in your mouth and seal your lips tightly around it. 5. Breathe in slowly and as deeply as possible, raising the piston or the ball toward the top of the column. 6. Hold your breath for 3-5 seconds or for as long as possible. Allow the piston or ball to fall to the bottom of the column. 7. Remove the mouthpiece from your mouth and breathe out normally. 8. Rest for a few seconds and repeat Steps 1 through 7 at least 10 times every 1-2 hours when you are awake. Take your time and take a few normal breaths between deep breaths. 9. The spirometer may include an indicator to show your best effort. Use the indicator as a goal to work toward during each repetition. 10. After each set of 10 deep breaths, practice coughing to be sure your lungs are clear. If you have an incision (the cut made at the time of surgery), support your incision when coughing by placing a pillow or rolled up towels firmly against it. Once you are able to get out of bed, walk around indoors and cough well. You may stop using the incentive spirometer when instructed by your caregiver.  RISKS AND COMPLICATIONS  Take your time so you do not get dizzy or light-headed.  If you are in pain, you may need to take or ask for pain medication before doing incentive spirometry. It is harder to take a deep breath if you are having pain. AFTER  USE  Rest and breathe slowly and easily.  It can be helpful to keep track of a log of your progress. Your caregiver can provide you with a simple table to help with this. If you are using the spirometer at home, follow these instructions: SEEK MEDICAL CARE IF:   You are having difficultly using the spirometer.  You have trouble using the spirometer as often as instructed.  Your pain medication is not giving enough relief while using the spirometer.  You develop fever of 100.5 F (38.1 C) or higher. SEEK IMMEDIATE MEDICAL CARE IF:   You cough up bloody sputum that had not been present before.  You develop fever of 102 F (38.9 C) or greater.  You develop worsening pain at or near the incision site. MAKE SURE YOU:   Understand these instructions.  Will watch your condition.  Will get help right away if you are not doing well or get worse. Document Released: 06/21/2006 Document Revised: 05/03/2011 Document Reviewed: 08/22/2006 [facility] Patient Information 2014 [place], [place].   ________________________________________________________________________

## 2019-01-29 ENCOUNTER — Encounter (HOSPITAL_COMMUNITY)
Admission: RE | Admit: 2019-01-29 | Discharge: 2019-01-29 | Disposition: A | Discharge status: Home / Self Care | Payer: BLUE CROSS/BLUE SHIELD | Source: Ambulatory Visit | Attending: Orthopedic Surgery | Admitting: Orthopedic Surgery

## 2019-01-29 ENCOUNTER — Other Ambulatory Visit: Payer: Self-pay

## 2019-01-29 ENCOUNTER — Encounter (HOSPITAL_COMMUNITY): Payer: Self-pay

## 2019-01-29 DIAGNOSIS — M13862 Other specified arthritis, left knee: Secondary | ICD-10-CM | POA: Insufficient documentation

## 2019-01-29 DIAGNOSIS — Z01818 Encounter for other preprocedural examination: Secondary | ICD-10-CM | POA: Insufficient documentation

## 2019-01-29 LAB — CBC
HCT: 48.8 % (ref 39.0–52.0)
Hemoglobin: 15.4 g/dL (ref 13.0–17.0)
MCH: 29.1 pg (ref 26.0–34.0)
MCHC: 31.6 g/dL (ref 30.0–36.0)
MCV: 92.1 fL (ref 80.0–100.0)
Platelets: 259 10*3/uL (ref 150–400)
RBC: 5.3 MIL/uL (ref 4.22–5.81)
RDW: 13.2 % (ref 11.5–15.5)
WBC: 12.8 10*3/uL — ABNORMAL HIGH (ref 4.0–10.5)
nRBC: 0 % (ref 0.0–0.2)

## 2019-01-29 LAB — SURGICAL PCR SCREEN
MRSA, PCR: NEGATIVE
Staphylococcus aureus: NEGATIVE

## 2019-01-29 LAB — BASIC METABOLIC PANEL
Anion gap: 9 (ref 5–15)
BUN: 12 mg/dL (ref 6–20)
CO2: 29 mmol/L (ref 22–32)
Calcium: 9.7 mg/dL (ref 8.9–10.3)
Chloride: 101 mmol/L (ref 98–111)
Creatinine, Ser: 0.98 mg/dL (ref 0.61–1.24)
GFR calc Af Amer: >60 mL/min (ref >60)
GFR calc non Af Amer: >60 mL/min (ref >60)
Glucose, Bld: 132 mg/dL — ABNORMAL HIGH (ref 70–99)
Potassium: 3.8 mmol/L (ref 3.5–5.1)
Sodium: 139 mmol/L (ref 135–145)

## 2019-01-29 LAB — HEMOGLOBIN A1C
Hgb A1c MFr Bld: 6.2 % — ABNORMAL HIGH (ref 4.8–5.6)
Mean Plasma Glucose: 131.24 mg/dL

## 2019-01-29 NOTE — Progress Notes (Addendum)
PCP -  Patient does not have one Cardiologist - N/A  Chest x-ray - N/A EKG -  Stress Test -N/A  ECHO -N/A  Cardiac Cath - N/A  Sleep Study - N/A CPAP - N/A  Fasting Blood Sugar - N/A Checks Blood Sugar __0___ times a day  Blood Thinner Instructions:N/A Aspirin Instructions:N/A Last Dose:takes Ibuprofen and last dose was 01/27/2019  Anesthesia review:  Chart given to Konrad Felix, PA to review labs.  Patient denies shortness of breath, fever, cough and chest pain at PAT appointment   Patient verbalized understanding of instructions that were given to them at the PAT appointment. Patient was also instructed that they will need to review over the PAT instructions again at home before surgery.

## 2019-02-02 ENCOUNTER — Other Ambulatory Visit (HOSPITAL_COMMUNITY)
Admission: RE | Admit: 2019-02-02 | Discharge: 2019-02-02 | Disposition: A | Discharge status: Home / Self Care | Payer: BLUE CROSS/BLUE SHIELD | Source: Ambulatory Visit | Attending: Orthopedic Surgery | Admitting: Orthopedic Surgery

## 2019-02-02 DIAGNOSIS — Z01812 Encounter for preprocedural laboratory examination: Secondary | ICD-10-CM | POA: Diagnosis present

## 2019-02-02 DIAGNOSIS — Z20828 Contact with and (suspected) exposure to other viral communicable diseases: Secondary | ICD-10-CM | POA: Insufficient documentation

## 2019-02-03 LAB — NOVEL CORONAVIRUS, NAA (HOSP ORDER, SEND-OUT TO REF LAB; TAT 18-24 HRS): SARS-CoV-2, NAA: NOT DETECTED

## 2019-02-05 NOTE — Anesthesia Preprocedure Evaluation (Addendum)
Anesthesia Evaluation  Patient identified by MRN, date of birth, ID band Patient awake    Reviewed: Allergy & Precautions, NPO status , Patient's Chart, lab work & pertinent test results  Airway Mallampati: III  TM Distance: >3 FB Neck ROM: Full    Dental  (+) Missing, Poor Dentition   Pulmonary Current Smoker and Patient abstained from smoking.,    Pulmonary exam normal breath sounds clear to auscultation       Cardiovascular negative cardio ROS Normal cardiovascular exam Rhythm:Regular Rate:Normal     Neuro/Psych negative neurological ROS  negative psych ROS   GI/Hepatic negative GI ROS, Neg liver ROS,   Endo/Other  negative endocrine ROS  Renal/GU negative Renal ROS     Musculoskeletal  (+) Arthritis ,   Abdominal (+) + obese,   Peds  Hematology negative hematology ROS (+)   Anesthesia Other Findings djd left kne  Reproductive/Obstetrics                            Anesthesia Physical Anesthesia Plan  ASA: II  Anesthesia Plan: Spinal and Regional   Post-op Pain Management:  Regional for Post-op pain   Induction: Intravenous  PONV Risk Score and Plan: 0 and Ondansetron, Dexamethasone, Midazolam and Treatment may vary due to age or medical condition  Airway Management Planned: Simple Face Mask  Additional Equipment:   Intra-op Plan:   Post-operative Plan:   Informed Consent: I have reviewed the patients History and Physical, chart, labs and discussed the procedure including the risks, benefits and alternatives for the proposed anesthesia with the patient or authorized representative who has indicated his/her understanding and acceptance.     Dental advisory given  Plan Discussed with: CRNA  Anesthesia Plan Comments:         Anesthesia Quick Evaluation

## 2019-02-05 NOTE — Care Plan (Signed)
Spoke with patient prior to surgery. He plans to discharge to home, same day if possible, with family and go to [place]. This has been arranged at [facility] for 02/08/19 @ 100. Patient has rolling walker at home. Patient and MD aware and agreeable with plan. Choice offered.    Ladell Heads, [place]

## 2019-02-06 ENCOUNTER — Encounter (HOSPITAL_COMMUNITY)
Admission: RE | Disposition: A | Discharge status: Home / Self Care | Payer: Self-pay | Source: Home / Self Care | Attending: Orthopedic Surgery

## 2019-02-06 ENCOUNTER — Encounter (HOSPITAL_COMMUNITY): Payer: Self-pay | Admitting: Orthopedic Surgery

## 2019-02-06 ENCOUNTER — Ambulatory Visit (HOSPITAL_COMMUNITY)
Admission: RE | Admit: 2019-02-06 | Discharge: 2019-02-06 | Disposition: A | Discharge status: Home / Self Care | Payer: BLUE CROSS/BLUE SHIELD | Attending: Orthopedic Surgery | Admitting: Orthopedic Surgery

## 2019-02-06 ENCOUNTER — Ambulatory Visit (HOSPITAL_COMMUNITY): Payer: BLUE CROSS/BLUE SHIELD | Admitting: Physician Assistant

## 2019-02-06 ENCOUNTER — Ambulatory Visit (HOSPITAL_COMMUNITY): Payer: BLUE CROSS/BLUE SHIELD | Admitting: Certified Registered"

## 2019-02-06 ENCOUNTER — Ambulatory Visit (HOSPITAL_COMMUNITY): Payer: BLUE CROSS/BLUE SHIELD

## 2019-02-06 DIAGNOSIS — Z91018 Allergy to other foods: Secondary | ICD-10-CM | POA: Insufficient documentation

## 2019-02-06 DIAGNOSIS — F17210 Nicotine dependence, cigarettes, uncomplicated: Secondary | ICD-10-CM | POA: Insufficient documentation

## 2019-02-06 DIAGNOSIS — M1712 Unilateral primary osteoarthritis, left knee: Secondary | ICD-10-CM | POA: Diagnosis present

## 2019-02-06 DIAGNOSIS — Z96652 Presence of left artificial knee joint: Secondary | ICD-10-CM

## 2019-02-06 HISTORY — PX: PARTIAL KNEE ARTHROPLASTY: SHX2174

## 2019-02-06 SURGERY — ARTHROPLASTY, KNEE, UNICOMPARTMENTAL
Anesthesia: Regional | Site: Knee | Laterality: Left

## 2019-02-06 MED ORDER — ONDANSETRON HCL 4 MG/2ML IJ SOLN
INTRAMUSCULAR | Status: AC
  Filled 2019-02-06: qty 2

## 2019-02-06 MED ORDER — POVIDONE-IODINE 10 % EX SWAB
2.0000 "application " | Freq: Once | CUTANEOUS | Status: AC
  Administered 2019-02-06: 2 via TOPICAL

## 2019-02-06 MED ORDER — CEFAZOLIN SODIUM-DEXTROSE 2-4 GM/100ML-% IV SOLN: 2.0000 g | Freq: Four times a day (QID) | INTRAVENOUS | Status: DC

## 2019-02-06 MED ORDER — TRANEXAMIC ACID-NACL 1000-0.7 MG/100ML-% IV SOLN
1000.0000 mg | Freq: Once | INTRAVENOUS | Status: DC
  Filled 2019-02-06: qty 100

## 2019-02-06 MED ORDER — KETOROLAC TROMETHAMINE 15 MG/ML IJ SOLN: 7.5000 mg | Freq: Four times a day (QID) | INTRAMUSCULAR | Status: DC

## 2019-02-06 MED ORDER — DEXAMETHASONE SODIUM PHOSPHATE 10 MG/ML IJ SOLN
INTRAMUSCULAR | Status: DC | PRN
  Administered 2019-02-06: 8 mg via INTRAVENOUS

## 2019-02-06 MED ORDER — PROPOFOL 500 MG/50ML IV EMUL
INTRAVENOUS | Status: AC
  Filled 2019-02-06: qty 50

## 2019-02-06 MED ORDER — SENNA-DOCUSATE SODIUM 8.6-50 MG PO TABS: 2.0000 | ORAL_TABLET | Freq: Every day | ORAL | 1 refills | Status: DC

## 2019-02-06 MED ORDER — OXYCODONE HCL 5 MG/5ML PO SOLN: 5.0000 mg | Freq: Once | ORAL | Status: DC | PRN

## 2019-02-06 MED ORDER — METHOCARBAMOL 500 MG PO TABS: 500.0000 mg | ORAL_TABLET | Freq: Four times a day (QID) | ORAL | Status: DC | PRN

## 2019-02-06 MED ORDER — PROMETHAZINE HCL 25 MG/ML IJ SOLN: 6.2500 mg | INTRAMUSCULAR | Status: DC | PRN

## 2019-02-06 MED ORDER — PROPOFOL 500 MG/50ML IV EMUL
INTRAVENOUS | Status: DC | PRN
  Administered 2019-02-06: 75 ug/kg/min via INTRAVENOUS

## 2019-02-06 MED ORDER — ONDANSETRON HCL 4 MG PO TABS: 4.0000 mg | ORAL_TABLET | Freq: Three times a day (TID) | ORAL | 0 refills | Status: DC | PRN

## 2019-02-06 MED ORDER — KETOROLAC TROMETHAMINE 30 MG/ML IJ SOLN
INTRAMUSCULAR | Status: DC | PRN
  Administered 2019-02-06: 30 mg via INTRAMUSCULAR

## 2019-02-06 MED ORDER — POTASSIUM CHLORIDE IN NACL 20-0.45 MEQ/L-% IV SOLN
INTRAVENOUS | Status: DC
  Filled 2019-02-06: qty 1000

## 2019-02-06 MED ORDER — PROPOFOL 10 MG/ML IV BOLUS
INTRAVENOUS | Status: AC
  Filled 2019-02-06: qty 20

## 2019-02-06 MED ORDER — HYDROCODONE-ACETAMINOPHEN 5-325 MG PO TABS
ORAL_TABLET | ORAL | Status: AC
  Administered 2019-02-06: 13:00:00 2 via ORAL
  Filled 2019-02-06: qty 2

## 2019-02-06 MED ORDER — OXYCODONE HCL 5 MG PO TABS: 5.0000 mg | ORAL_TABLET | Freq: Once | ORAL | Status: DC | PRN

## 2019-02-06 MED ORDER — ONDANSETRON HCL 4 MG/2ML IJ SOLN
INTRAMUSCULAR | Status: DC | PRN
  Administered 2019-02-06: 4 mg via INTRAVENOUS

## 2019-02-06 MED ORDER — BUPIVACAINE HCL 0.25 % IJ SOLN
INTRAMUSCULAR | Status: DC | PRN
  Administered 2019-02-06: 30 mL

## 2019-02-06 MED ORDER — SODIUM CHLORIDE 0.9 % IR SOLN
Status: DC | PRN
  Administered 2019-02-06: 1000 mL

## 2019-02-06 MED ORDER — LACTATED RINGERS IV SOLN: INTRAVENOUS | Status: DC

## 2019-02-06 MED ORDER — STERILE WATER FOR IRRIGATION IR SOLN
Status: DC | PRN
  Administered 2019-02-06: 2000 mL

## 2019-02-06 MED ORDER — MEPIVACAINE HCL (PF) 2 % IJ SOLN
INTRAMUSCULAR | Status: DC | PRN
  Administered 2019-02-06: 3.4 mg via INTRATHECAL

## 2019-02-06 MED ORDER — KETOROLAC TROMETHAMINE 15 MG/ML IJ SOLN
INTRAMUSCULAR | Status: AC
  Administered 2019-02-06: 13:00:00 7.5 mg via INTRAVENOUS
  Filled 2019-02-06: qty 1

## 2019-02-06 MED ORDER — DEXAMETHASONE SODIUM PHOSPHATE 10 MG/ML IJ SOLN
INTRAMUSCULAR | Status: AC
  Filled 2019-02-06: qty 1

## 2019-02-06 MED ORDER — OXYCODONE HCL 5 MG PO TABS: 5.0000 mg | ORAL_TABLET | ORAL | 0 refills | Status: DC | PRN

## 2019-02-06 MED ORDER — TRANEXAMIC ACID-NACL 1000-0.7 MG/100ML-% IV SOLN
1000.0000 mg | INTRAVENOUS | Status: AC
  Administered 2019-02-06: 1000 mg via INTRAVENOUS
  Filled 2019-02-06: qty 100

## 2019-02-06 MED ORDER — FENTANYL CITRATE (PF) 100 MCG/2ML IJ SOLN
INTRAMUSCULAR | Status: AC
  Filled 2019-02-06: qty 2

## 2019-02-06 MED ORDER — HYDROMORPHONE HCL 1 MG/ML IJ SOLN: 0.2500 mg | INTRAMUSCULAR | Status: DC | PRN

## 2019-02-06 MED ORDER — EPHEDRINE 5 MG/ML INJ
INTRAVENOUS | Status: AC
  Filled 2019-02-06: qty 10

## 2019-02-06 MED ORDER — EPHEDRINE SULFATE-NACL 50-0.9 MG/10ML-% IV SOSY
PREFILLED_SYRINGE | INTRAVENOUS | Status: DC | PRN
  Administered 2019-02-06: 10 mg via INTRAVENOUS
  Administered 2019-02-06 (×2): 5 mg via INTRAVENOUS

## 2019-02-06 MED ORDER — KETOROLAC TROMETHAMINE 30 MG/ML IJ SOLN
INTRAMUSCULAR | Status: AC
  Filled 2019-02-06: qty 1

## 2019-02-06 MED ORDER — MIDAZOLAM HCL 2 MG/2ML IJ SOLN
INTRAMUSCULAR | Status: AC
  Filled 2019-02-06: qty 2

## 2019-02-06 MED ORDER — PHENYLEPHRINE HCL-NACL 10-0.9 MG/250ML-% IV SOLN
INTRAVENOUS | Status: DC | PRN
  Administered 2019-02-06: 20 ug/min via INTRAVENOUS

## 2019-02-06 MED ORDER — ROPIVACAINE HCL 5 MG/ML IJ SOLN
INTRAMUSCULAR | Status: DC | PRN
  Administered 2019-02-06: 30 mL via PERINEURAL

## 2019-02-06 MED ORDER — CLONIDINE HCL (ANALGESIA) 100 MCG/ML EP SOLN
EPIDURAL | Status: DC | PRN
  Administered 2019-02-06: 100 ug

## 2019-02-06 MED ORDER — HYDROCODONE-ACETAMINOPHEN 5-325 MG PO TABS: 1.0000 | ORAL_TABLET | ORAL | Status: DC | PRN

## 2019-02-06 MED ORDER — ASPIRIN EC 325 MG PO TBEC: 325.0000 mg | DELAYED_RELEASE_TABLET | Freq: Two times a day (BID) | ORAL | 0 refills | Status: AC

## 2019-02-06 MED ORDER — PROPOFOL 10 MG/ML IV BOLUS
INTRAVENOUS | Status: DC | PRN
  Administered 2019-02-06 (×2): 20 mg via INTRAVENOUS

## 2019-02-06 MED ORDER — METHOCARBAMOL 500 MG IVPB - SIMPLE MED: 500.0000 mg | Freq: Four times a day (QID) | INTRAVENOUS | Status: DC | PRN

## 2019-02-06 MED ORDER — BUPIVACAINE HCL (PF) 0.25 % IJ SOLN
INTRAMUSCULAR | Status: AC
  Filled 2019-02-06: qty 30

## 2019-02-06 MED ORDER — METHOCARBAMOL 500 MG IVPB - SIMPLE MED
INTRAVENOUS | Status: AC
  Administered 2019-02-06: 500 mg via INTRAVENOUS
  Filled 2019-02-06: qty 50

## 2019-02-06 MED ORDER — HYDROCODONE-ACETAMINOPHEN 7.5-325 MG PO TABS: 1.0000 | ORAL_TABLET | ORAL | Status: DC | PRN

## 2019-02-06 MED ORDER — BACLOFEN 10 MG PO TABS: 10.0000 mg | ORAL_TABLET | Freq: Three times a day (TID) | ORAL | 0 refills | Status: DC

## 2019-02-06 MED ORDER — MIDAZOLAM HCL 2 MG/2ML IJ SOLN
INTRAMUSCULAR | Status: DC | PRN
  Administered 2019-02-06 (×2): 1 mg via INTRAVENOUS

## 2019-02-06 MED ORDER — CEFAZOLIN SODIUM-DEXTROSE 2-4 GM/100ML-% IV SOLN
2.0000 g | INTRAVENOUS | Status: AC
  Administered 2019-02-06: 2 g via INTRAVENOUS
  Filled 2019-02-06: qty 100

## 2019-02-06 MED ORDER — ACETAMINOPHEN 500 MG PO TABS
1000.0000 mg | ORAL_TABLET | Freq: Once | ORAL | Status: AC
  Administered 2019-02-06: 06:00:00 1000 mg via ORAL
  Filled 2019-02-06: qty 2

## 2019-02-06 MED ORDER — FENTANYL CITRATE (PF) 100 MCG/2ML IJ SOLN
INTRAMUSCULAR | Status: DC | PRN
  Administered 2019-02-06 (×2): 25 ug via INTRAVENOUS
  Administered 2019-02-06: 50 ug via INTRAVENOUS

## 2019-02-06 MED ORDER — PHENYLEPHRINE HCL (PRESSORS) 10 MG/ML IV SOLN
INTRAVENOUS | Status: AC
  Filled 2019-02-06: qty 1

## 2019-02-06 MED ORDER — TRANEXAMIC ACID-NACL 1000-0.7 MG/100ML-% IV SOLN: 1000.0000 mg | Freq: Once | INTRAVENOUS | Status: DC

## 2019-02-06 MED ORDER — LIDOCAINE 2% (20 MG/ML) 5 ML SYRINGE
INTRAMUSCULAR | Status: AC
  Filled 2019-02-06: qty 5

## 2019-02-06 MED ORDER — 0.9 % SODIUM CHLORIDE (POUR BTL) OPTIME
TOPICAL | Status: DC | PRN
  Administered 2019-02-06: 1000 mL

## 2019-02-06 MED ORDER — CHLORHEXIDINE GLUCONATE 4 % EX LIQD: 60.0000 mL | Freq: Once | CUTANEOUS | Status: DC

## 2019-02-06 SURGICAL SUPPLY — 67 items
BAG ZIPLOCK 12X15 (MISCELLANEOUS) ×3 IMPLANT
BANDAGE ESMARK 6X9 LF (GAUZE/BANDAGES/DRESSINGS) ×1 IMPLANT
BEARING TIBIAL SZ 3 MED 3 (Knees) ×2 IMPLANT
BEARING TIBIAL SZ 3 MED 3MM (Knees) ×1 IMPLANT
BLADE SURG 15 STRL LF DISP TIS (BLADE) ×1 IMPLANT
BLADE SURG 15 STRL SS (BLADE) ×2
BNDG ELASTIC 6X15 VLCR STRL LF (GAUZE/BANDAGES/DRESSINGS) ×3 IMPLANT
BNDG ESMARK 6X9 LF (GAUZE/BANDAGES/DRESSINGS) ×3
BOWL SMART MIX CTS (DISPOSABLE) ×3 IMPLANT
CEMENT BONE R 1X40 (Cement) ×3 IMPLANT
CLOSURE STERI-STRIP 1/2X4 (GAUZE/BANDAGES/DRESSINGS) ×1
CLSR STERI-STRIP ANTIMIC 1/2X4 (GAUZE/BANDAGES/DRESSINGS) ×2 IMPLANT
COMPONENT TIBIA MEDL OXFRD LFT (Joint) ×1 IMPLANT
COVER SURGICAL LIGHT HANDLE (MISCELLANEOUS) ×3 IMPLANT
COVER WAND RF STERILE (DRAPES) IMPLANT
CUFF TOURN SGL QUICK 34 (TOURNIQUET CUFF) ×2
CUFF TRNQT CYL 34X4.125X (TOURNIQUET CUFF) ×1 IMPLANT
DECANTER SPIKE VIAL GLASS SM (MISCELLANEOUS) IMPLANT
DRAPE EXTREMITY T 121X128X90 (DISPOSABLE) ×3 IMPLANT
DRAPE POUCH INSTRU U-SHP 10X18 (DRAPES) ×3 IMPLANT
DRAPE SHEET LG 3/4 BI-LAMINATE (DRAPES) ×3 IMPLANT
DRAPE U-SHAPE 47X51 STRL (DRAPES) ×3 IMPLANT
DRSG MEPILEX BORDER 4X8 (GAUZE/BANDAGES/DRESSINGS) ×3 IMPLANT
DRSG PAD ABDOMINAL 8X10 ST (GAUZE/BANDAGES/DRESSINGS) ×3 IMPLANT
DURAPREP 26ML APPLICATOR (WOUND CARE) ×6 IMPLANT
ELECT REM PT RETURN 15FT ADLT (MISCELLANEOUS) ×3 IMPLANT
FACESHIELD WRAPAROUND (MASK) ×3 IMPLANT
GLOVE BIO SURGEON STRL SZ7 (GLOVE) ×3 IMPLANT
GLOVE BIOGEL PI IND STRL 7.0 (GLOVE) ×1 IMPLANT
GLOVE BIOGEL PI IND STRL 8 (GLOVE) ×1 IMPLANT
GLOVE BIOGEL PI INDICATOR 7.0 (GLOVE) ×2
GLOVE BIOGEL PI INDICATOR 8 (GLOVE) ×2
GLOVE SURG SS PI 7.5 STRL IVOR (GLOVE) ×3 IMPLANT
GOWN STRL REUS W/TWL LRG LVL3 (GOWN DISPOSABLE) ×6 IMPLANT
HANDPIECE INTERPULSE COAX TIP (DISPOSABLE) ×2
HOLDER FOLEY CATH W/STRAP (MISCELLANEOUS) IMPLANT
HOOD PEEL AWAY FLYTE STAYCOOL (MISCELLANEOUS) ×9 IMPLANT
IMMOBILIZER KNEE 20 (SOFTGOODS)
IMMOBILIZER KNEE 20 THIGH 36 (SOFTGOODS) IMPLANT
IMMOBILIZER KNEE 22 UNIV (SOFTGOODS) ×3 IMPLANT
KIT BASIN OR (CUSTOM PROCEDURE TRAY) ×3 IMPLANT
KIT TURNOVER KIT A (KITS) IMPLANT
NDL SAFETY ECLIPSE 18X1.5 (NEEDLE) ×1 IMPLANT
NEEDLE HYPO 18GX1.5 SHARP (NEEDLE) ×2
NS IRRIG 1000ML POUR BTL (IV SOLUTION) ×3 IMPLANT
PACK BLADE SAW RECIP 70 3 PT (BLADE) ×3 IMPLANT
PACK ICE MAXI GEL EZY WRAP (MISCELLANEOUS) ×3 IMPLANT
PACK TOTAL JOINT (CUSTOM PROCEDURE TRAY) ×3 IMPLANT
PEG TWIN FEM CEMENTED MED (Knees) ×3 IMPLANT
PENCIL SMOKE EVACUATOR (MISCELLANEOUS) IMPLANT
PROTECTOR NERVE ULNAR (MISCELLANEOUS) ×3 IMPLANT
SET HNDPC FAN SPRY TIP SCT (DISPOSABLE) ×1 IMPLANT
SUCTION FRAZIER HANDLE 12FR (TUBING) ×2
SUCTION TUBE FRAZIER 12FR DISP (TUBING) ×1 IMPLANT
SUT VIC AB 1 CT1 36 (SUTURE) ×3 IMPLANT
SUT VIC AB 2-0 CT1 27 (SUTURE) ×2
SUT VIC AB 2-0 CT1 TAPERPNT 27 (SUTURE) ×1 IMPLANT
SUT VIC AB 3-0 SH 8-18 (SUTURE) ×3 IMPLANT
SYR 30ML LL (SYRINGE) ×3 IMPLANT
SYR 3ML LL SCALE MARK (SYRINGE) ×3 IMPLANT
TIBIA MEDIAL OXFORD LEFT (Joint) ×3 IMPLANT
TOWEL OR 17X26 10 PK STRL BLUE (TOWEL DISPOSABLE) ×3 IMPLANT
TOWEL OR NON WOVEN STRL DISP B (DISPOSABLE) ×3 IMPLANT
TRAY CATH 16FR W/PLASTIC CATH (SET/KITS/TRAYS/PACK) ×3 IMPLANT
TRAY FOLEY MTR SLVR 16FR STAT (SET/KITS/TRAYS/PACK) IMPLANT
WATER STERILE IRR 1000ML POUR (IV SOLUTION) ×3 IMPLANT
WRAP KNEE MAXI GEL POST OP (GAUZE/BANDAGES/DRESSINGS) ×3 IMPLANT

## 2019-02-06 NOTE — Anesthesia Postprocedure Evaluation (Signed)
Anesthesia Post Note  Patient: [name] [name]  Procedure(s) Performed: UNICOMPARTMENTAL KNEE (Left Knee)     Patient location during evaluation: PACU Anesthesia Type: Regional and Spinal Level of consciousness: oriented and awake and alert Pain management: pain level controlled Vital Signs Assessment: post-procedure vital signs reviewed and stable Respiratory status: spontaneous breathing, respiratory function stable and patient connected to nasal cannula oxygen Cardiovascular status: blood pressure returned to baseline and stable Postop Assessment: no headache, no backache, no apparent nausea or vomiting and spinal receding Anesthetic complications: no    Last Vitals:  Vitals:   02/06/19 1030 02/06/19 1045  BP: 118/79 120/88  Pulse: 65 77  Resp: 10 14  Temp: (!) 35.9 C (!) 36.3 C  SpO2: 99% 98%    Last Pain:  Vitals:   02/06/19 1050  TempSrc:   PainSc: 0-No pain                 Suleman Gunning P Steadman Prosperi

## 2019-02-06 NOTE — H&P (Signed)
PREOPERATIVE H&P  Chief Complaint: left knee pain  HPI: [name] [name] is a 58 y.o. male who presents for preoperative history and physical with a diagnosis of left knee oa. Symptoms are rated as moderate to severe, and have been worsening.  This is significantly impairing activities of daily living.  He has elected for surgical management.   He has failed injections, activity modification, anti-inflammatories, and assistive devices.  Preoperative X-rays demonstrate end stage degenerative changes with osteophyte formation, loss of joint space, subchondral sclerosis.   Past Medical History:  Diagnosis Date  . Arthritis    NECK AND SHOULDERS   Past Surgical History:  Procedure Laterality Date  . EYE SURGERY     58 YRS OLD   Social History   Socioeconomic History  . Marital status: Widowed    Spouse name: Not on file  . Number of children: Not on file  . Years of education: Not on file  . Highest education level: Not on file  Occupational History  . Not on file  Tobacco Use  . Smoking status: Current Some Day Smoker    Packs/day: 1.00    Years: 20.00    Pack years: 20.00    Types: Cigarettes  . Smokeless tobacco: Current User    Types: Snuff  . Tobacco comment: 10 cigarettes a week  Substance and Sexual Activity  . Alcohol use: Yes    Comment: COUPLE OF BEERS A WEEK  . Drug use: Never  . Sexual activity: Not on file  Other Topics Concern  . Not on file  Social History Narrative  . Not on file   Social Determinants of Health   Financial Resource Strain:   . Difficulty of Paying Living Expenses: Not on file  Food Insecurity:   . Worried About Programme researcher, broadcasting/film/video in the Last Year: Not on file  . Ran Out of Food in the Last Year: Not on file  Transportation Needs:   . Lack of Transportation (Medical): Not on file  . Lack of Transportation (Non-Medical): Not on file  Physical Activity:   . Days of Exercise per Week: Not on file  . Minutes of Exercise per  Session: Not on file  Stress:   . Feeling of Stress : Not on file  Social Connections:   . Frequency of Communication with Friends and Family: Not on file  . Frequency of Social Gatherings with Friends and Family: Not on file  . Attends Religious Services: Not on file  . Active Member of Clubs or Organizations: Not on file  . Attends Banker Meetings: Not on file  . Marital Status: Not on file   Family History  Problem Relation Age of Onset  . Cancer Father        LUNG   Allergies  Allergen Reactions  . Coconut Oil Hives    Anything with coconut   Prior to Admission medications   Medication Sig Start Date End Date Taking? Authorizing Provider  ibuprofen (ADVIL) 200 MG tablet Take 800 mg by mouth every 6 (six) hours as needed for mild pain or moderate pain.    Yes [provider]  Multiple Vitamin (MULTIVITAMIN WITH MINERALS) TABS tablet Take 1 tablet by mouth daily.   Yes [provider]  meloxicam (MOBIC) 7.5 MG tablet Take 2 tablets (15 mg total) by mouth daily. Patient not taking: Reported on 01/17/2019 10/01/18   Garlon Hatchet, PA-C  Menthol-Methyl Salicylate (SALONPAS PAIN RELIEF PATCH EX) Apply 1  patch topically daily as needed (pain).    [provider]     Positive ROS: All other systems have been reviewed and were otherwise negative with the exception of those mentioned in the HPI and as above.  Physical Exam: General: Alert, no acute distress Cardiovascular: No pedal edema Respiratory: No cyanosis, no use of accessory musculature GI: No organomegaly, abdomen is soft and non-tender Skin: No lesions in the area of chief complaint Neurologic: Sensation intact distally Psychiatric: Patient is competent for consent with normal mood and affect Lymphatic: No axillary or cervical lymphadenopathy  MUSCULOSKELETAL: left knee with pseudolaxity and medial crepitance, 0-120  Assessment: Left knee anteromedial  osteoarthritis   Plan: Plan for Procedure(s): UNICOMPARTMENTAL KNEE  The risks benefits and alternatives were discussed with the patient including but not limited to the risks of nonoperative treatment, versus surgical intervention including infection, bleeding, nerve injury,  blood clots, cardiopulmonary complications, morbidity, mortality, among others, and they were willing to proceed.    Patient's anticipated LOS is less than 2 midnights, meeting these requirements: - Younger than 49 - Lives within 1 hour of care - Has a competent adult at home to recover with post-op recover - NO history of  - Chronic pain requiring opiods  - Diabetes  - Coronary Artery Disease  - Heart failure  - Heart attack  - Stroke  - DVT/VTE  - Cardiac arrhythmia  - Respiratory Failure/COPD  - Renal failure  - Anemia  - Advanced Liver disease        Johnny Bridge, MD Cell [phone]   02/06/2019 7:20 AM

## 2019-02-06 NOTE — Anesthesia Procedure Notes (Signed)
Spinal  Patient location during procedure: OR Start time: 02/06/2019 7:30 AM End time: 02/06/2019 7:40 AM Staffing Performed: anesthesiologist  Anesthesiologist: Murvin Natal, MD Preanesthetic Checklist Completed: patient identified, IV checked, risks and benefits discussed, surgical consent, monitors and equipment checked, pre-op evaluation and timeout performed Spinal Block Patient position: sitting Prep: DuraPrep Patient monitoring: cardiac monitor, continuous pulse ox and blood pressure Approach: midline Location: L4-5 Injection technique: single-shot Needle Needle type: Pencan  Needle gauge: 24 G Needle length: 9 cm Assessment Sensory level: T10 Additional Notes Functioning IV was confirmed and monitors were applied. Sterile prep and drape, including hand hygiene and sterile gloves were used. The patient was positioned and the spine was prepped. The skin was anesthetized with lidocaine.  Free flow of clear CSF was obtained prior to injecting local anesthetic into the CSF.  The spinal needle aspirated freely following injection.  The needle was carefully withdrawn.  The patient tolerated the procedure well.

## 2019-02-06 NOTE — Anesthesia Procedure Notes (Signed)
Anesthesia Regional Block: Adductor canal block   Pre-Anesthetic Checklist: ,, timeout performed, Correct Patient, Correct Site, Correct Laterality, Correct Procedure,, site marked, risks and benefits discussed, Surgical consent,  Pre-op evaluation,  At surgeon's request and post-op pain management  Laterality: Left  Prep: chloraprep       Needles:  Injection technique: Single-shot  Needle Type: Echogenic Stimulator Needle     Needle Length: 9cm  Needle Gauge: 21     Additional Needles:   Procedures:,,,, ultrasound used (permanent image in chart),,,,  Narrative:  Start time: 02/06/2019 6:55 AM End time: 02/06/2019 7:05 AM Injection made incrementally with aspirations every 5 mL.  Performed by: Personally  Anesthesiologist: Murvin Natal, MD  Additional Notes: Functioning IV was confirmed and monitors were applied. A time-out was performed. Hand hygiene and sterile gloves were used. The thigh was placed in a frog-leg position and prepped in a sterile fashion. A 49mm 21ga Arrow echogenic stimulator needle was placed using ultrasound guidance.  Negative aspiration and negative test dose prior to incremental administration of local anesthetic. The patient tolerated the procedure well.

## 2019-02-06 NOTE — Op Note (Signed)
02/06/2019  9:32 AM  PATIENT:  [name] [name]    PRE-OPERATIVE DIAGNOSIS: Left anteromedial knee osteoarthritis  POST-OPERATIVE DIAGNOSIS:  Same  PROCEDURE:  Unicompartmental Knee Arthroplasty  SURGEON:  Johnny Bridge, MD  PHYSICIAN ASSISTANT: Merlene Pulling, PA-C, present and scrubbed throughout the case, critical for completion in a timely fashion, and for retraction, instrumentation, and closure.  ANESTHESIA:   Spinal with abductor canal block and intra-articular injection  ESTIMATED BLOOD LOSS: 75 mL  UNIQUE ASPECTS OF THE CASE: The tibial cut was fairly thin, and I had slight undercut of the spine, but not bad.  It was structurally stable.  The 3 was slightly tight, but felt appropriate.  He had some grade 2 changes under the patella on the medial facet.  PREOPERATIVE INDICATIONS:  [name] [name] is a  58 y.o. male with a diagnosis of djd left knee who failed conservative measures and elected for surgical management.    The risks benefits and alternatives were discussed with the patient preoperatively including but not limited to the risks of infection, bleeding, nerve injury, cardiopulmonary complications, blood clots, the need for revision surgery, among others, and the patient was willing to proceed.  OPERATIVE IMPLANTS: Biomet [employer] mobile bearing medial compartment arthroplasty femur size medium, tibia size C, bearing size 3.  OPERATIVE FINDINGS: Endstage grade 4 medial compartment osteoarthritis. No significant changes in the lateral or patellofemoral joint with the exception of some grade 2 changes on the medial facet..  The ACL was intact.  OPERATIVE PROCEDURE: The patient was brought to the operating room placed in the supine position.  Regional anesthesia was administered. IV antibiotics were given. The lower extremity was placed in the legholder and prepped and draped in usual sterile fashion.  Time out was performed.  The leg was elevated and exsanguinated  and the tourniquet was inflated. Anteromedial incision was performed, and I took care to preserve the MCL. Parapatellar incision was carried out, and the osteophytes were excised, along with the medial meniscus and a small portion of the fat pad.  The extra medullary tibial cutting jig was applied, using the spoon and the 21mm G-Clamp and the 2 mm shim, and I took care to protect the anterior cruciate ligament insertion and the tibial spine. The medial collateral ligament was also protected, and I resected my proximal tibia, matching the anatomic slope.   The proximal tibial bony cut was removed in one piece, and I turned my attention to the femur.  The intramedullary femoral rod was placed using the drill, and then using the appropriate reference, I assembled the femoral jig, setting my posterior cutting block. I resected my posterior femur, used the 0 spigot for the anterior femur, and then measured my gap.   I then used the appropriate mill to match the extension gap to the flexion gap. The second milling was at a 3, and then again at a 5.  The gaps were then measured again with the appropriate feeler gauges. Once I had balanced flexion and extension gaps, I then completed the preparation of the femur.  I milled off the anterior aspect of the distal femur to prevent impingement. I also exposed the tibia, and selected the above-named component, and then used the cutting jig to prepare the keel slot on the tibia. I also used the awl to curette out the bone to complete the preparation of the keel. The back wall was intact.  I then placed trial components, and it was found to have excellent  motion, and appropriate balance.  I then cemented the components into place, cementing the tibia first, removing all excess cement, and then cementing the femur.  All loose cement was removed.  The real polyethylene insert was applied manually, and the knee was taken through functional range of motion, and found to  have excellent stability and restoration of joint motion, with excellent balance.  The wounds were irrigated copiously, and the parapatellar tissue closed with Vicryl, followed by Vicryl for the subcutaneous tissue, with routine closure with Steri-Strips and sterile gauze.  The tourniquet was released, and the patient was awakened and extubated and returned to PACU in stable and satisfactory condition. There were no complications.

## 2019-02-06 NOTE — Evaluation (Signed)
Physical Therapy Evaluation Patient Details Name: [name] [name] MRN: [id number] DOB: [birth year] Today's Date: 02/06/2019   History of Present Illness  Patient is 58 y.o. male s/p Lt TKA on 02/06/19 with PMH significant for OA.  Clinical Impression  [name] [name] is a 58 y.o. male POD 0 s/p Lt TKA. Patient reports independence with mobility at baseline. Patient is now limited by functional impairments (see PT problem list below) and requires min guard/supervision for transfers and gait with RW. Patient was able to ambulate ~75 feet with RW and min guard and cues for safe walker management. Patient educated on safe sequencing for stair mobility and verbalized safe guarding position for people assisting with mobility. Patient instructed in exercises to facilitate ROM and circulation. Patient will benefit from continued skilled PT interventions to address impairments and progress towards PLOF. Patient has met mobility goals at adequate level for discharge home; will continue to follow if pt continues acute stay to progress towards Mod I goals.     Follow Up Recommendations Follow surgeon's recommendation for DC plan and follow-up therapies    Equipment Recommendations  None recommended by PT    Recommendations for Other Services       Precautions / Restrictions Precautions Precautions: Fall Restrictions Weight Bearing Restrictions: No      Mobility  Bed Mobility Overal bed mobility: Needs Assistance Bed Mobility: Supine to Sit;Sit to Supine     Supine to sit: Supervision Sit to supine: Supervision   General bed mobility comments: no cues or assist required for patient to sit up EOB, verbal cues provided for use of gait belt or Rt LE to assist with raising Lt LE into bed, no physical assist required.  Transfers Overall transfer level: Needs assistance Equipment used: Rolling walker (2 wheeled) Transfers: Sit to/from Stand Sit to Stand: Min guard;From elevated surface         General transfer comment: cues for safe hand placement and technique with RW, no assist requried to initiate power up from EOB, guarding provided for safety.  Ambulation/Gait Ambulation/Gait assistance: Min guard Gait Distance (Feet): 75 Feet Assistive device: Rolling walker (2 wheeled) Gait Pattern/deviations: Step-to pattern;Decreased stride length;Decreased step length - right;Decreased stance time - left;Decreased weight shift to left;Antalgic Gait velocity: decreased   General Gait Details: verbal cues for safe step pattern and to maintain safe proximity to RW, assist requried for walker positioning initially and pt improved. no overt LOB observed during gait.  Stairs Stairs: Yes Stairs assistance: Supervision Stair Management: Backwards;With walker Number of Stairs: 2(2x 1) General stair comments: pt educated on safe backwards step up technique with use of RW to ascend and descend threshold at home. min guard provided and verbal cues for sequencing "up wtih good, down with bad" on first bout. Pt able to complete second time without cues. pt verbalized understanding of safe positioning for person(s) guarding/assisting him.  Wheelchair Mobility    Modified Rankin (Stroke Patients Only)       Balance Overall balance assessment: Needs assistance Sitting-balance support: Feet supported Sitting balance-[name] Scale: Good     Standing balance support: Bilateral upper extremity supported;During functional activity Standing balance-[name] Scale: Fair                Pertinent Vitals/Pain Pain Assessment: 0-10 Pain Score: 7  Pain Location: Lt knee Pain Descriptors / Indicators: Aching;Sore Pain Intervention(s): Limited activity within patient's tolerance;Monitored during session;Repositioned    Home Living Family/patient expects to be discharged to:: Private residence Living Arrangements: Other  relatives;Non-relatives/Friends(pt lives with a friend and his wife as  well as his grandkids (ages 47 and 25)) Available Help at Discharge: Friend(s)(pt firend does not work and the 2 kids are at home, pt's friends wife is still working) Type of Home: House Home Access: Ramped entrance;Stairs to enter   [employer] of Steps: ramp and then 1 threshold to enter home Home Layout: One [place]: Environmental consultant - 2 wheels;Cane - single point Additional Comments: might have BSC in the attic, needs his friend to check    Prior Function Level of Independence: Independent         Comments: pt still works for [employer], community [employer]   Dominant Hand: Right    Extremity/Trunk Assessment   Upper Extremity Assessment Upper Extremity Assessment: Overall WFL for tasks assessed    Lower Extremity Assessment Lower Extremity Assessment: Overall WFL for tasks assessed;LLE deficits/detail LLE Deficits / Details: good quad activation and no extensor lag observed with SLR, 4/5 for quad strength with MMT in LAQ position LLE Sensation: WNL LLE Coordination: WNL    Cervical / Trunk Assessment Cervical / Trunk Assessment: Normal  Communication   Communication: No difficulties  Cognition Arousal/Alertness: Awake/alert Behavior During Therapy: WFL for tasks assessed/performed Overall Cognitive Status: Within Functional Limits for tasks assessed               General Comments      Exercises Total Joint Exercises Ankle Circles/Pumps: AROM;10 reps;Supine;Both Quad Sets: AROM;5 reps;Supine;Left Short Arc Quad: AROM;5 reps;Supine;Left Heel Slides: 5 reps;Supine;AAROM;Left Hip ABduction/ADduction: AROM;5 reps;Supine;Left Straight Leg Raises: AROM;5 reps;Supine;Left Long Arc Quad: AROM;5 reps;Seated;Left Knee Flexion: AROM;AAROM;10 reps;Seated;Left(1x5 AROM, 1x5 AAROM)   Assessment/Plan    PT Assessment Patient needs continued PT services  PT Problem List Decreased strength;Decreased activity tolerance;Decreased range of  motion;Decreased balance;Decreased mobility;Decreased knowledge of use of DME       PT Treatment Interventions DME instruction;Functional mobility training;Balance training;Patient/family education;Therapeutic activities;Gait training;Stair training;Therapeutic exercise    PT Goals (Current goals can be found in the Care Plan section)  Acute Rehab PT Goals Patient Stated Goal: to get home today PT Goal Formulation: With patient Time For Goal Achievement: 02/13/19 Potential to Achieve Goals: Good    Frequency 7X/week    AM-PAC PT "6 Clicks" Mobility  Outcome Measure Help needed turning from your back to your side while in a flat bed without using bedrails?: A Little Help needed moving from lying on your back to sitting on the side of a flat bed without using bedrails?: A Little Help needed moving to and from a bed to a chair (including a wheelchair)?: A Little Help needed standing up from a chair using your arms (e.g., wheelchair or bedside chair)?: A Little Help needed to walk in hospital room?: A Little Help needed climbing 3-5 steps with a railing? : A Little 6 Click Score: 18    End of Session Equipment Utilized During Treatment: Gait belt Activity Tolerance: Patient tolerated treatment well Patient left: in bed;with call bell/phone within reach Nurse Communication: Mobility status PT Visit Diagnosis: Muscle weakness (generalized) (M62.81);Difficulty in walking, not elsewhere classified (R26.2)    Time: 5093-2671 PT Time Calculation (min) (ACUTE ONLY): 31 min   Charges:   PT Evaluation $PT Eval Low Complexity: 1 Low PT Treatments $Gait Training: 8-22 mins        Kipp Brood, PT, DPT Physical Therapist with [facility]  02/06/2019 3:29 PM

## 2019-02-06 NOTE — Anesthesia Procedure Notes (Signed)
Procedure Name: MAC Date/Time: 02/06/2019 7:27 AM Performed by: Eben Burow, CRNA Pre-anesthesia Checklist: Patient identified, Emergency Drugs available, Suction available, Patient being monitored and Timeout performed Oxygen Delivery Method: Simple face mask Dental Injury: Teeth and Oropharynx as per pre-operative assessment

## 2019-02-06 NOTE — Transfer of Care (Signed)
Immediate Anesthesia Transfer of Care Note  Patient: [name] [name]  Procedure(s) Performed: UNICOMPARTMENTAL KNEE (Left Knee)  Patient Location: PACU  Anesthesia Type:Spinal  Level of Consciousness: awake, alert  and oriented  Airway & Oxygen Therapy: Patient Spontanous Breathing and Patient connected to face mask oxygen  Post-op Assessment: Report given to RN and Post -op Vital signs reviewed and stable  Post vital signs: Reviewed and stable  Last Vitals:  Vitals Value Taken Time  BP 128/80 02/06/19 0936  Temp    Pulse 80 02/06/19 0938  Resp 16 02/06/19 0938  SpO2 98 % 02/06/19 0938  Vitals shown include unvalidated device data.  Last Pain:  Vitals:   02/06/19 0600  TempSrc: Oral         Complications: No apparent anesthesia complications

## 2019-02-06 NOTE — Discharge Instructions (Signed)

## 2019-02-07 ENCOUNTER — Encounter: Payer: Self-pay | Admitting: *Deleted

## 2020-10-28 ENCOUNTER — Emergency Department (HOSPITAL_COMMUNITY): Payer: BC Managed Care – PPO

## 2020-10-28 ENCOUNTER — Other Ambulatory Visit: Payer: Self-pay

## 2020-10-28 ENCOUNTER — Observation Stay (HOSPITAL_COMMUNITY)
Admission: EM | Admit: 2020-10-28 | Discharge: 2020-10-29 | Disposition: A | Discharge status: Home / Self Care | Payer: BC Managed Care – PPO | Attending: Surgery | Admitting: Surgery

## 2020-10-28 DIAGNOSIS — Z7982 Long term (current) use of aspirin: Secondary | ICD-10-CM | POA: Insufficient documentation

## 2020-10-28 DIAGNOSIS — Z79899 Other long term (current) drug therapy: Secondary | ICD-10-CM | POA: Insufficient documentation

## 2020-10-28 DIAGNOSIS — K8012 Calculus of gallbladder with acute and chronic cholecystitis without obstruction: Principal | ICD-10-CM | POA: Insufficient documentation

## 2020-10-28 DIAGNOSIS — Z20822 Contact with and (suspected) exposure to covid-19: Secondary | ICD-10-CM | POA: Insufficient documentation

## 2020-10-28 DIAGNOSIS — F17210 Nicotine dependence, cigarettes, uncomplicated: Secondary | ICD-10-CM | POA: Diagnosis not present

## 2020-10-28 DIAGNOSIS — Z96652 Presence of left artificial knee joint: Secondary | ICD-10-CM | POA: Diagnosis not present

## 2020-10-28 DIAGNOSIS — K819 Cholecystitis, unspecified: Secondary | ICD-10-CM

## 2020-10-28 DIAGNOSIS — R1011 Right upper quadrant pain: Secondary | ICD-10-CM | POA: Diagnosis present

## 2020-10-28 DIAGNOSIS — K8000 Calculus of gallbladder with acute cholecystitis without obstruction: Secondary | ICD-10-CM | POA: Diagnosis present

## 2020-10-28 LAB — URINALYSIS, ROUTINE W REFLEX MICROSCOPIC
Bilirubin Urine: NEGATIVE
Glucose, UA: NEGATIVE mg/dL
Hgb urine dipstick: NEGATIVE
Ketones, ur: NEGATIVE mg/dL
Leukocytes,Ua: NEGATIVE
Nitrite: NEGATIVE
Protein, ur: NEGATIVE mg/dL
Specific Gravity, Urine: 1.027 (ref 1.005–1.030)
pH: 5 (ref 5.0–8.0)

## 2020-10-28 LAB — COMPREHENSIVE METABOLIC PANEL
ALT: 14 U/L (ref 0–44)
AST: 20 U/L (ref 15–41)
Albumin: 3.7 g/dL (ref 3.5–5.0)
Alkaline Phosphatase: 74 U/L (ref 38–126)
Anion gap: 9 (ref 5–15)
BUN: 15 mg/dL (ref 6–20)
CO2: 26 mmol/L (ref 22–32)
Calcium: 9.2 mg/dL (ref 8.9–10.3)
Chloride: 102 mmol/L (ref 98–111)
Creatinine, Ser: 0.82 mg/dL (ref 0.61–1.24)
GFR, Estimated: >60 mL/min (ref >60)
Glucose, Bld: 105 mg/dL — ABNORMAL HIGH (ref 70–99)
Potassium: 3.5 mmol/L (ref 3.5–5.1)
Sodium: 137 mmol/L (ref 135–145)
Total Bilirubin: 0.4 mg/dL (ref 0.3–1.2)
Total Protein: 6.8 g/dL (ref 6.5–8.1)

## 2020-10-28 LAB — CBC WITH DIFFERENTIAL/PLATELET
Abs Immature Granulocytes: 0.06 10*3/uL (ref 0.00–0.07)
Basophils Absolute: 0.1 10*3/uL (ref 0.0–0.1)
Basophils Relative: 1 %
Eosinophils Absolute: 0.3 10*3/uL (ref 0.0–0.5)
Eosinophils Relative: 2 %
HCT: 47 % (ref 39.0–52.0)
Hemoglobin: 15.6 g/dL (ref 13.0–17.0)
Immature Granulocytes: 0 %
Lymphocytes Relative: 15 %
Lymphs Abs: 2 10*3/uL (ref 0.7–4.0)
MCH: 30.8 pg (ref 26.0–34.0)
MCHC: 33.2 g/dL (ref 30.0–36.0)
MCV: 92.9 fL (ref 80.0–100.0)
Monocytes Absolute: 0.9 10*3/uL (ref 0.1–1.0)
Monocytes Relative: 7 %
Neutro Abs: 10.6 10*3/uL — ABNORMAL HIGH (ref 1.7–7.7)
Neutrophils Relative %: 75 %
Platelets: 237 10*3/uL (ref 150–400)
RBC: 5.06 MIL/uL (ref 4.22–5.81)
RDW: 13.6 % (ref 11.5–15.5)
WBC: 14 10*3/uL — ABNORMAL HIGH (ref 4.0–10.5)
nRBC: 0 % (ref 0.0–0.2)

## 2020-10-28 LAB — LIPASE, BLOOD: Lipase: 28 U/L (ref 11–51)

## 2020-10-28 NOTE — ED Triage Notes (Signed)
Pt reports abd pain for the past few days. Denies nausea, vomiting or diarrhea.

## 2020-10-28 NOTE — ED Provider Notes (Signed)
Emergency Medicine Provider Triage Evaluation Note  [name] [name] , a 60 y.o. male  was evaluated in triage.  Pt complains of right upper quadrant abdominal pain for "the last few days."  Reports that pain became worse today.  Pain worse with touch.  Review of Systems  Positive: Right upper quadrant abdominal pain Negative: Fever, chills, nausea, vomiting, blood in stool, melena, constipation, diarrhea, dysuria, hematuria, urinary frequency  Physical Exam  BP (!) 172/112 (BP Location: Left Arm)   Pulse 76   Temp 98.3 F (36.8 C) (Oral)   Resp (!) 22   Ht 5\' 11"  (1.803 m)   Wt 104 kg   SpO2 95%   BMI 31.98 kg/m  Gen:   Awake, no distress   Resp:  Normal effort  MSK:   Moves extremities without difficulty  Other:  Abdomen protuberant, soft, nondistended, tenderness to right upper quadrant.  No peritoneal signs.  Medical Decision Making  Medically screening exam initiated at 7:28 PM.  Appropriate orders placed.  [place] was informed that the remainder of the evaluation will be completed by another provider, this initial triage assessment does not replace that evaluation, and the importance of remaining in the ED until their evaluation is complete.     Charlott Rakes 10/28/20 1929    12/28/20, MD 10/28/20 [phone]

## 2020-10-29 ENCOUNTER — Encounter (HOSPITAL_COMMUNITY): Payer: Self-pay

## 2020-10-29 ENCOUNTER — Encounter (HOSPITAL_COMMUNITY)
Admission: EM | Disposition: A | Discharge status: Home / Self Care | Payer: Self-pay | Source: Home / Self Care | Attending: Emergency Medicine

## 2020-10-29 ENCOUNTER — Emergency Department (HOSPITAL_COMMUNITY): Payer: BC Managed Care – PPO | Admitting: Certified Registered Nurse Anesthetist

## 2020-10-29 ENCOUNTER — Other Ambulatory Visit: Payer: Self-pay

## 2020-10-29 DIAGNOSIS — K8000 Calculus of gallbladder with acute cholecystitis without obstruction: Secondary | ICD-10-CM | POA: Diagnosis present

## 2020-10-29 HISTORY — PX: CHOLECYSTECTOMY: SHX55

## 2020-10-29 LAB — RESP PANEL BY RT-PCR (FLU A&B, COVID) ARPGX2
Influenza A by PCR: NEGATIVE
Influenza B by PCR: NEGATIVE
SARS Coronavirus 2 by RT PCR: NEGATIVE

## 2020-10-29 SURGERY — LAPAROSCOPIC CHOLECYSTECTOMY
Anesthesia: General

## 2020-10-29 MED ORDER — ONDANSETRON HCL 4 MG/2ML IJ SOLN
INTRAMUSCULAR | Status: AC
  Filled 2020-10-29: qty 2

## 2020-10-29 MED ORDER — ROCURONIUM BROMIDE 10 MG/ML (PF) SYRINGE
PREFILLED_SYRINGE | INTRAVENOUS | Status: DC | PRN
  Administered 2020-10-29: 80 mg via INTRAVENOUS

## 2020-10-29 MED ORDER — SODIUM CHLORIDE 0.9 % IV BOLUS
1000.0000 mL | Freq: Once | INTRAVENOUS | Status: AC
  Administered 2020-10-29: 1000 mL via INTRAVENOUS

## 2020-10-29 MED ORDER — CHLORHEXIDINE GLUCONATE 0.12 % MT SOLN: 15.0000 mL | Freq: Once | OROMUCOSAL | Status: AC

## 2020-10-29 MED ORDER — HYDROMORPHONE HCL 1 MG/ML IJ SOLN
1.0000 mg | Freq: Once | INTRAMUSCULAR | Status: AC
  Administered 2020-10-29: 1 mg via INTRAVENOUS
  Filled 2020-10-29: qty 1

## 2020-10-29 MED ORDER — DEXAMETHASONE SODIUM PHOSPHATE 10 MG/ML IJ SOLN
INTRAMUSCULAR | Status: DC | PRN
  Administered 2020-10-29: 8 mg via INTRAVENOUS

## 2020-10-29 MED ORDER — FENTANYL CITRATE (PF) 250 MCG/5ML IJ SOLN
INTRAMUSCULAR | Status: DC | PRN
  Administered 2020-10-29: 100 ug via INTRAVENOUS
  Administered 2020-10-29 (×3): 50 ug via INTRAVENOUS

## 2020-10-29 MED ORDER — SODIUM CHLORIDE 0.9 % IR SOLN
Status: DC | PRN
  Administered 2020-10-29: 1000 mL

## 2020-10-29 MED ORDER — OXYCODONE-ACETAMINOPHEN 5-325 MG PO TABS
1.0000 | ORAL_TABLET | ORAL | 0 refills | Status: AC | PRN
Start: 2020-10-29 — End: 2021-10-29

## 2020-10-29 MED ORDER — ROCURONIUM BROMIDE 10 MG/ML (PF) SYRINGE
PREFILLED_SYRINGE | INTRAVENOUS | Status: AC
  Filled 2020-10-29: qty 10

## 2020-10-29 MED ORDER — LIDOCAINE 2% (20 MG/ML) 5 ML SYRINGE
INTRAMUSCULAR | Status: DC | PRN
Start: 2020-10-29 — End: 2020-10-29
  Administered 2020-10-29: 80 mg via INTRAVENOUS

## 2020-10-29 MED ORDER — MIDAZOLAM HCL 2 MG/2ML IJ SOLN
INTRAMUSCULAR | Status: AC
  Filled 2020-10-29: qty 2

## 2020-10-29 MED ORDER — ONDANSETRON HCL 4 MG/2ML IJ SOLN
4.0000 mg | Freq: Once | INTRAMUSCULAR | Status: AC
  Administered 2020-10-29: 4 mg via INTRAVENOUS
  Filled 2020-10-29: qty 2

## 2020-10-29 MED ORDER — SUGAMMADEX SODIUM 200 MG/2ML IV SOLN
INTRAVENOUS | Status: DC | PRN
  Administered 2020-10-29: 200 mg via INTRAVENOUS

## 2020-10-29 MED ORDER — FAMOTIDINE 20 MG IN NS 100 ML IVPB
20.0000 mg | Freq: Once | INTRAVENOUS | Status: DC
  Filled 2020-10-29: qty 100

## 2020-10-29 MED ORDER — OXYCODONE HCL 5 MG PO TABS: 5.0000 mg | ORAL_TABLET | Freq: Once | ORAL | Status: DC | PRN

## 2020-10-29 MED ORDER — 0.9 % SODIUM CHLORIDE (POUR BTL) OPTIME
TOPICAL | Status: DC | PRN
  Administered 2020-10-29: 1000 mL

## 2020-10-29 MED ORDER — BUPIVACAINE-EPINEPHRINE (PF) 0.25% -1:200000 IJ SOLN
INTRAMUSCULAR | Status: AC
  Filled 2020-10-29: qty 30

## 2020-10-29 MED ORDER — PHENYLEPHRINE 40 MCG/ML (10ML) SYRINGE FOR IV PUSH (FOR BLOOD PRESSURE SUPPORT)
PREFILLED_SYRINGE | INTRAVENOUS | Status: AC
  Filled 2020-10-29: qty 10

## 2020-10-29 MED ORDER — SUCCINYLCHOLINE CHLORIDE 200 MG/10ML IV SOSY
PREFILLED_SYRINGE | INTRAVENOUS | Status: AC
  Filled 2020-10-29: qty 10

## 2020-10-29 MED ORDER — CHLORHEXIDINE GLUCONATE 0.12 % MT SOLN
OROMUCOSAL | Status: AC
  Administered 2020-10-29: 15 mL via OROMUCOSAL
  Filled 2020-10-29: qty 15

## 2020-10-29 MED ORDER — ACETAMINOPHEN 500 MG PO TABS: 1000.0000 mg | ORAL_TABLET | ORAL | Status: AC

## 2020-10-29 MED ORDER — PROPOFOL 10 MG/ML IV BOLUS
INTRAVENOUS | Status: DC | PRN
  Administered 2020-10-29: 200 mg via INTRAVENOUS

## 2020-10-29 MED ORDER — SODIUM CHLORIDE 0.9 % IV SOLN
2.0000 g | Freq: Once | INTRAVENOUS | Status: AC
  Administered 2020-10-29: 2 g via INTRAVENOUS
  Filled 2020-10-29: qty 20

## 2020-10-29 MED ORDER — ORAL CARE MOUTH RINSE: 15.0000 mL | Freq: Once | OROMUCOSAL | Status: AC

## 2020-10-29 MED ORDER — PROMETHAZINE HCL 25 MG/ML IJ SOLN: 6.2500 mg | INTRAMUSCULAR | Status: DC | PRN

## 2020-10-29 MED ORDER — FENTANYL CITRATE (PF) 250 MCG/5ML IJ SOLN
INTRAMUSCULAR | Status: AC
  Filled 2020-10-29: qty 5

## 2020-10-29 MED ORDER — BUPIVACAINE HCL 0.25 % IJ SOLN
INTRAMUSCULAR | Status: DC | PRN
  Administered 2020-10-29: 30 mL

## 2020-10-29 MED ORDER — LIDOCAINE 2% (20 MG/ML) 5 ML SYRINGE
INTRAMUSCULAR | Status: AC
  Filled 2020-10-29: qty 5

## 2020-10-29 MED ORDER — DEXAMETHASONE SODIUM PHOSPHATE 10 MG/ML IJ SOLN
INTRAMUSCULAR | Status: AC
  Filled 2020-10-29: qty 1

## 2020-10-29 MED ORDER — MIDAZOLAM HCL 2 MG/2ML IJ SOLN
INTRAMUSCULAR | Status: DC | PRN
  Administered 2020-10-29: 2 mg via INTRAVENOUS

## 2020-10-29 MED ORDER — LACTATED RINGERS IV SOLN: INTRAVENOUS | Status: DC

## 2020-10-29 MED ORDER — DEXAMETHASONE SODIUM PHOSPHATE 10 MG/ML IJ SOLN
INTRAMUSCULAR | Status: AC
  Filled 2020-10-29: qty 2

## 2020-10-29 MED ORDER — PROPOFOL 10 MG/ML IV BOLUS
INTRAVENOUS | Status: AC
  Filled 2020-10-29: qty 20

## 2020-10-29 MED ORDER — FENTANYL CITRATE (PF) 100 MCG/2ML IJ SOLN: 25.0000 ug | INTRAMUSCULAR | Status: DC | PRN

## 2020-10-29 MED ORDER — ACETAMINOPHEN 500 MG PO TABS
ORAL_TABLET | ORAL | Status: AC
  Administered 2020-10-29: 1000 mg via ORAL
  Filled 2020-10-29: qty 2

## 2020-10-29 MED ORDER — ONDANSETRON HCL 4 MG/2ML IJ SOLN
INTRAMUSCULAR | Status: DC | PRN
Start: 2020-10-29 — End: 2020-10-29
  Administered 2020-10-29: 4 mg via INTRAVENOUS

## 2020-10-29 MED ORDER — OXYCODONE HCL 5 MG/5ML PO SOLN
5.0000 mg | Freq: Once | ORAL | Status: DC | PRN
Start: 2020-10-29 — End: 2020-10-30

## 2020-10-29 SURGICAL SUPPLY — 46 items
APPLIER CLIP 5 13 M/L LIGAMAX5 (MISCELLANEOUS) ×2
BAG COUNTER SPONGE SURGICOUNT (BAG) ×2 IMPLANT
CANISTER SUCT 3000ML PPV (MISCELLANEOUS) ×2 IMPLANT
CHLORAPREP W/TINT 26 (MISCELLANEOUS) ×2 IMPLANT
CLIP APPLIE 5 13 M/L LIGAMAX5 (MISCELLANEOUS) ×1 IMPLANT
COVER MAYO STAND STRL (DRAPES) ×2 IMPLANT
COVER SURGICAL LIGHT HANDLE (MISCELLANEOUS) ×2 IMPLANT
DERMABOND ADVANCED (GAUZE/BANDAGES/DRESSINGS) ×1
DERMABOND ADVANCED .7 DNX12 (GAUZE/BANDAGES/DRESSINGS) ×1 IMPLANT
DRAPE C-ARM 42X120 X-RAY (DRAPES) ×2 IMPLANT
ELECT REM PT RETURN 9FT ADLT (ELECTROSURGICAL) ×2
ELECTRODE REM PT RTRN 9FT ADLT (ELECTROSURGICAL) ×1 IMPLANT
ENDOLOOP SUT PDS II  0 18 (SUTURE) ×2
ENDOLOOP SUT PDS II 0 18 (SUTURE) ×1 IMPLANT
GLOVE SRG 8 PF TXTR STRL LF DI (GLOVE) ×1 IMPLANT
GLOVE SURG ENC MOIS LTX SZ7.5 (GLOVE) ×2 IMPLANT
GLOVE SURG UNDER POLY LF SZ8 (GLOVE) ×2
GOWN STRL REUS W/ TWL LRG LVL3 (GOWN DISPOSABLE) ×2 IMPLANT
GOWN STRL REUS W/ TWL XL LVL3 (GOWN DISPOSABLE) ×1 IMPLANT
GOWN STRL REUS W/TWL LRG LVL3 (GOWN DISPOSABLE) ×4
GOWN STRL REUS W/TWL XL LVL3 (GOWN DISPOSABLE) ×2
GRASPER SUT TROCAR 14GX15 (MISCELLANEOUS) ×2 IMPLANT
IRRIG SUCT STRYKERFLOW 2 WTIP (MISCELLANEOUS) ×2
IRRIGATION SUCT STRKRFLW 2 WTP (MISCELLANEOUS) ×1 IMPLANT
IV CATH 14GX2 1/4 (CATHETERS) ×2 IMPLANT
KIT BASIN OR (CUSTOM PROCEDURE TRAY) ×2 IMPLANT
KIT TURNOVER KIT B (KITS) ×2 IMPLANT
NEEDLE INSUFFLATION 14GA 120MM (NEEDLE) ×2 IMPLANT
NS IRRIG 1000ML POUR BTL (IV SOLUTION) ×2 IMPLANT
PAD ARMBOARD 7.5X6 YLW CONV (MISCELLANEOUS) ×2 IMPLANT
POUCH RETRIEVAL ECOSAC 10 (ENDOMECHANICALS) ×1 IMPLANT
POUCH RETRIEVAL ECOSAC 10MM (ENDOMECHANICALS) ×2
SCISSORS LAP 5X35 DISP (ENDOMECHANICALS) ×2 IMPLANT
SET CHOLANGIOGRAPH 5 50 .035 (SET/KITS/TRAYS/PACK) ×2 IMPLANT
SET IRRIG TUBING LAPAROSCOPIC (IRRIGATION / IRRIGATOR) ×2 IMPLANT
SET TUBE SMOKE EVAC HIGH FLOW (TUBING) ×2 IMPLANT
SLEEVE ENDOPATH XCEL 5M (ENDOMECHANICALS) ×4 IMPLANT
SPECIMEN JAR SMALL (MISCELLANEOUS) ×2 IMPLANT
SUT MNCRL AB 4-0 PS2 18 (SUTURE) ×2 IMPLANT
TOWEL GREEN STERILE (TOWEL DISPOSABLE) ×2 IMPLANT
TOWEL GREEN STERILE FF (TOWEL DISPOSABLE) ×2 IMPLANT
TRAY LAPAROSCOPIC MC (CUSTOM PROCEDURE TRAY) ×2 IMPLANT
TROCAR XCEL NON-BLD 11X100MML (ENDOMECHANICALS) ×2 IMPLANT
TROCAR XCEL NON-BLD 5MMX100MML (ENDOMECHANICALS) ×2 IMPLANT
WARMER LAPAROSCOPE (MISCELLANEOUS) ×2 IMPLANT
WATER STERILE IRR 1000ML POUR (IV SOLUTION) ×2 IMPLANT

## 2020-10-29 NOTE — Op Note (Signed)
Patient: [name] [name] (June 10, 1960, [id number])  Date of Surgery: 10/28/2020 - 10/29/2020   Preoperative Diagnosis: Cholecystitis   Postoperative Diagnosis: Cholecystitis   Surgical Procedure: LAPAROSCOPIC CHOLECYSTECTOMY:    Operative Team Members:  Surgeon(s) and Role:    * Hesper Venturella, Hyman Hopes, MD - Primary   Anesthesiologist: Mellody Dance, MD CRNA: Epifanio Lesches, CRNA   Anesthesia: General   Fluids:  Total I/O In: 1900 [I.V.:800; IV Piggyback:1100] Out: 10 [Blood:10]  Complications: None  Drains:  none   Specimen:  ID Type Source Tests Collected by Time Destination  1 : gallbladder   GI Gallbladder SURGICAL PATHOLOGY Tymeir Weathington, Hyman Hopes, MD 10/29/2020 1530      Disposition:  PACU - hemodynamically stable.  Plan of Care: Admit for overnight observation    Indications for Procedure: Hollister Wessler Rager is a 60 y.o. male who presented with abdominal pain.  History, physical and imaging was concerning for cholecystitis.  Laparoscopic cholecystectomy was recommended for the patient.  The procedure itself, as well as the risks, benefits and alternatives were discussed with the patient.  Risks discussed included but were not limited to the risk of infection, bleeding, damage to nearby structures, need to convert to open procedure, incisional hernia, bile leak, common bile duct injury and the need for additional procedures or surgeries.  With this discussion complete and all questions answered the patient granted consent to proceed.  Findings: Inflammed gallbladder with hydrops    Description of Procedure:   Infection status: Patient: [name] [name] Emergency General Surgery Service Patient Case: Urgent Infection Present At Time Of Surgery (PATOS): spillage of some hydrops from the gallbladder during surgery  On the date stated above, the patient was taken to the operating room suite and placed in supine positioning.  Sequential compression devices were placed on the  lower extremities to prevent blood clots.  General endotracheal anesthesia was induced. Preoperative antibiotics were given within 30 minutes of incision.  The patient's abdomen was prepped and draped in the usual sterile fashion.  A time-out was completed verifying the correct patient, procedure, positioning and equipment needed for the case.  We began by anesthetizing the skin with local anesthetic and then making a 5 mm incision just below the umbilicus.  We dissected through the subcutaneous tissues to the fascia.  The fascia was grasped and elevated using a Kocher clamp.  A Veress needle was inserted into the abdomen and the abdomen was insufflated to 15 mmHg.  A 5 mm trocar was inserted in this position under optical guidance and then the abdomen was inspected.  There was no trauma to the underlying viscera with initial trocar placement.  Any abnormal findings, other than inflammation in the right upper quadrant, are listed above in the findings section.  Three additional trocars were placed, one 12 mm trocar in the subxiphoid position, one 5 mm trocar in the midline epigastric area and one 52mm trocar in the right upper quadrant subcostally.  These were placed under direct vision without any trauma to the underlying viscera.    The patient was then placed in head up, left side down positioning.  The gallbladder was identified and dissected free from its attachments to the omentum allowing the duodenum to fall away.  The infundibulum of the gallbladder was dissected free working laterally to medially.  The cystic duct and cystic artery were dissected free from surrounding connective tissue.  The infundibulum of the gallbladder was dissected off the cystic plate.  A critical view of  safety was obtained with the cystic duct and cystic artery being cleared of connective tissues and clearly the only two structures entering into the gallbladder with the liver clearly visible behind.  Clips were then applied to  the cystic duct and cystic artery and then these structures were divided.  An endoloop was secured around the cystic duct stump.  The gallbladder was dissected off the cystic plate, placed in an endocatch bag and removed from the 12 mm subxiphoid port site.  The clips were inspected and appeared effective.  The cystic plate was inspected and hemostasis was obtained using electrocautery.  A suction irrigator was used to clean the operative field.  Attention was turned to closure.  The 12 mm subxiphoid port site was closed using a 0-vicryl suture on a fascial suture passer.  The abdomen was desufflated.  The skin was closed using 4-0 monocryl and dermabond.  All sponge and needle counts were correct at the conclusion of the case.    Ivar Drape, MD General, Bariatric, & Minimally Invasive Surgery [facility] Surgery, [place]

## 2020-10-29 NOTE — ED Notes (Signed)
Pt ambulatory to restroom with unsteady gait. Stand by assist for safety. Pt undressed and belongings placed in bags and labeled. Wallet and glasses also in bags at bedside. 2 bags total. All jewelry except 1 ring (cant not get off) placed in labeled cup and is in belonging bags.

## 2020-10-29 NOTE — Interval H&P Note (Signed)
Patient seen and examined. I recommended laparoscopic cholecystectomy with possible intraoperative cholangiogram for cholecystitis. The procedure itself was discussed as well as risk and benefits come in alternatives. After full discussion questions answered Patient granted consent to proceed. We will proceed as scheduled.

## 2020-10-29 NOTE — Discharge Summary (Signed)
  Patient ID: [name] [name] [id number] 60 y.o. 1960/03/21  10/28/2020  Discharge date and time: 10/29/2020  Admitting Physician: [name] [name]  Discharge Physician: [name] [name]  Admission Diagnoses: Cholecystitis [K81.9] Right upper quadrant pain [R10.11] Acute cholecystitis due to biliary calculus [K80.00] Cholecystitis, acute with cholelithiasis [K80.00] Patient Active Problem List   Diagnosis Date Noted   Acute cholecystitis due to biliary calculus 10/29/2020   Cholecystitis, acute with cholelithiasis 10/29/2020   Arthritis of left knee 02/06/2019     Discharge Diagnoses: Cholecystitis Patient Active Problem List   Diagnosis Date Noted   Acute cholecystitis due to biliary calculus 10/29/2020   Cholecystitis, acute with cholelithiasis 10/29/2020   Arthritis of left knee 02/06/2019    Operations: Procedure(s): LAPAROSCOPIC CHOLECYSTECTOMY  Admission Condition: good  Discharged Condition: good  Indication for Admission: Cholecystitis  Hospital Course: [name] [name] presented with cholecystitis.  He underwent laparoscopic cholecystectomy and was discharged.  Consults: None  Significant Diagnostic Studies: Ultrasound  Treatments: surgery: as above  Disposition: Home  Patient Instructions:  Allergies as of 10/29/2020       Reactions   Coconut Oil Hives   Anything with coconut        Medication List     TAKE these medications    acetaminophen 500 MG tablet Commonly known as: TYLENOL Take 1,000 mg by mouth every 6 (six) hours as needed for mild pain.   aspirin EC 325 MG tablet Take 1 tablet (325 mg total) by mouth 2 (two) times daily. What changed:  when to take this reasons to take this   oxyCODONE-acetaminophen 5-325 MG tablet Commonly known as: Percocet Take 1 tablet by mouth every 4 (four) hours as needed for severe pain.   SALONPAS PAIN RELIEF PATCH EX Apply 1 patch topically daily as needed (pain).        Activity: no  heavy lifting for 4 weeks Diet: regular diet Wound Care: keep wound clean and dry  Follow-up:  With Dr. Dossie [name] in 4 weeks.  Signed: Hyman Hopes Leaann [name] General, Bariatric, & Minimally Invasive Surgery [facility], [place]   10/29/2020, 7:37 PM

## 2020-10-29 NOTE — Discharge Instructions (Addendum)
 CHOLECYSTECTOMY POST OPERATIVE INSTRUCTIONS  Thinking Clearly  The anesthesia may cause you to feel different for 1 or 2 days. Do not drive, drink alcohol, or make any big decisions for at least 2 days.  Nutrition When you wake up, you will be able to drink small amounts of liquid. If you do not feel sick, you can slowly advance your diet to regular foods. Continue to drink lots of fluids, usually about 8 to 10 glasses per day. Eat a high-fiber diet so you don't strain during bowel movements. High-Fiber Foods Foods high in fiber include beans, bran cereals and whole-grain breads, peas, dried fruit (figs, apricots, and dates), raspberries, blackberries, strawberries, sweet corn, broccoli, baked potatoes with skin, plums, pears, apples, greens, and nuts. Activity Slowly increase your activity. Be sure to get up and walk every hour or so to prevent blood clots. No heavy lifting or strenuous activity for 4 weeks following surgery to prevent hernias at your incision sites It is normal to feel tired. You may need more sleep than usual.  Get your rest but make sure to get up and move around frequently to prevent blood clots and pneumonia.  Work and Return to School You can go back to work when you feel well enough. Discuss the timing with your surgeon. You can usually go back to school or work 1 week after an operation. If your work requires heavy lifting or strenuous activity you need to be placed on light duty for 4 weeks following surgery. You can return to gym class, sports or other physical activities 4 weeks after surgery.  Wound Care Always wash your hands before and after touching near your incision site. Do not soak in a bathtub until cleared at your follow up appointment. You may take a shower 24 hours after surgery. A small amount of drainage from the incision is normal. If the drainage is thick and yellow or the site is red, you may have an infection, so call your surgeon. If you  have a drain in one of your incisions, it will be taken out in office when the drainage stops. Steri-Strips will fall off in 7 to 10 days or they will be removed during your first office visit. If you have dermabond glue covering over the incision, allow the glue to flake off on its own. Avoid wearing tight or rough clothing. It may rub your incisions and make it harder for them to heal. Protect the new skin, especially from the sun. The sun can burn and cause darker scarring. Your scar will heal in about 4 to 6 weeks and will become softer and continue to fade over the next year.  The cosmetic appearance of the incisions will improve over the course of the first year after surgery. Sensation around your incision will return in a few weeks or months.  Bowel Movements After intestinal surgery, you may have loose watery stools for several days. If watery diarrhea lasts longer than 3 days, contact your surgeon. Pain medication (narcotics) can cause constipation. Increase the fiber in your diet with high-fiber foods if you are constipated. You can take an over the counter stool softener like Colace to avoid constipation.  Additional over the counter medications can also be used if Colace isn't sufficient (for example, Milk of Magnesia or Miralax).  Pain The amount of pain is different for each person. Some people need only 1 to 3 doses of pain control medication, while others need more. Take alternating doses of tylenol   and ibuprofen around the clock for the first five days following surgery.  This will provide a baseline of pain control and help with inflammation.  Take the narcotic pain medication in addition if needed for severe pain.  Contact Your Surgeon at [phone], if you have: Pain in your right upper abdomen like a gallbladder attack. Pain that will not go away Pain that gets worse A fever of more than 101F (38.3C) Repeated vomiting Swelling, redness, bleeding, or bad-smelling  drainage from your wound site Strong abdominal pain No bowel movement or unable to pass gas for 3 days Watery diarrhea lasting longer than 3 days  Pain Control The goal of pain control is to minimize pain, keep you moving and help you heal. Your surgical team will work with you on your pain plan. Most often a combination of therapies and medications are used to control your pain. You may also be given medication (local anesthetic) at the surgical site. This may help control your pain for several days. Extreme pain puts extra stress on your body at a time when your body needs to focus on healing. Do not wait until your pain has reached a level "10" or is unbearable before telling your doctor or nurse. It is much easier to control pain before it becomes severe. Following a laparoscopic procedure, pain is sometimes felt in the shoulder. This is due to the gas inserted into your abdomen during the procedure. Moving and walking helps to decrease the gas and the right shoulder pain.  Use the guide below for ways to manage your post-operative pain. Learn more by going to facs.org/safepaincontrol.  How Intense Is My Pain Common Therapies to Feel Better       I hardly notice my pain, and it does not interfere with my activities.  I notice my pain and it distracts me, but I can still do activities (sitting up, walking, standing).  Non-Medication Therapies  Ice (in a bag, applied over clothing at the surgical site), elevation, rest, meditation, massage, distraction (music, TV, play) walking and mild exercise Splinting the abdomen with pillows +  Non-Opioid Medications Acetaminophen (Tylenol) Non-steroidal anti-inflammatory drugs (NSAIDS) Aspirin, Ibuprofen (Motrin, Advil) Naproxen (Aleve) Take these as needed, when you feel pain. Both acetaminophen and NSAIDs help to decrease pain and swelling (inflammation).      My pain is hard to ignore and is more noticeable even when I rest.  My  pain interferes with my usual activities.  Non-Medication Therapies  +  Non-Opioid medications  Take on a regular schedule (around-the-clock) instead of as needed. (For example, Tylenol every 6 hours at 9:00 am, 3:00 pm, 9:00 pm, 3:00 am and Motrin every 6 hours at 12:00 am, 6:00 am, 12:00 pm, 6:00 pm)         I am focused on my pain, and I am not doing my daily activities.  I am groaning in pain, and I cannot sleep. I am unable to do anything.  My pain is as bad as it could be, and nothing else matters.  Non-Medication Therapies  +  Around-the-Clock Non-Opioid Medications  +  Short-acting opioids  Opioids should be used with other medications to manage severe pain. Opioids block pain and give a feeling of euphoria (feel high). Addiction, a serious side effect of opioids, is rare with short-term (a few days) use.  Examples of short-acting opioids include: Tramadol (Ultram), Hydrocodone (Norco, Vicodin), Hydromorphone (Dilaudid), Oxycodone (Oxycontin)     The above directions have been adapted from   the American College of [employer] Surgical Patient Education Program.  Please refer to the ACS website if needed: https://[email or web address].org/-/media/files/education/[email or web address]-[email or web address].[email or web address].   Paul Stechschulte, MD [facility] [address], [address], [place], [place]  [address] ?  [address] [address], [place], [place]   [address] [phone] ? [phone] ? FAX [phone] Web site: [email or web address]  

## 2020-10-29 NOTE — Progress Notes (Signed)
Patient originally had an admit order and was placed on PACU hold at 1655. MD Stechschulte contacted at 1700 regarding patient discharge from PACU. MD was entering surgery and was unable to place discharge orders at that time. Hold time changed to Phase 2 time.

## 2020-10-29 NOTE — H&P (Signed)
[name] [name] is an 60 y.o. male.   Chief Complaint: RUQ pain HPI: This is a 61 year old male with a smoking history who presents with 3-4 days of intermittent RUQ abdominal pain that worsened yesterday.  No nausea, vomiting, or diarrhea.  Pain became fairly severe, so he presented to the ED for evaluation.  He was found to have acute cholecystitis.  Past Medical History:  Diagnosis Date   Arthritis    NECK AND SHOULDERS    Past Surgical History:  Procedure Laterality Date   EYE SURGERY     60 YRS OLD   PARTIAL KNEE ARTHROPLASTY Left 02/06/2019   Procedure: UNICOMPARTMENTAL KNEE;  Surgeon: Teryl Lucy, MD;  Location: [facility];  Service: Orthopedics;  Laterality: Left;    Family History  Problem Relation Age of Onset   Cancer Father        LUNG   Social History:  reports that he has been smoking cigarettes. He has a 20.00 pack-year smoking history. His smokeless tobacco use includes snuff. He reports current alcohol use. He reports that he does not use drugs.  Allergies:  Allergies  Allergen Reactions   Coconut Oil Hives    Anything with coconut   Prior to Admission medications   Medication Sig Start Date End Date Taking? Authorizing Provider  aspirin EC 325 MG tablet Take 1 tablet (325 mg total) by mouth 2 (two) times daily. 02/06/19   Armida Sans, PA-C  baclofen (LIORESAL) 10 MG tablet Take 1 tablet (10 mg total) by mouth 3 (three) times daily. As needed for muscle spasm 02/06/19   Janine Ores K, PA-C  Menthol-Methyl Salicylate (SALONPAS PAIN RELIEF PATCH EX) Apply 1 patch topically daily as needed (pain).    [provider]  Multiple Vitamin (MULTIVITAMIN WITH MINERALS) TABS tablet Take 1 tablet by mouth daily.    [provider]  ondansetron (ZOFRAN) 4 MG tablet Take 1 tablet (4 mg total) by mouth every 8 (eight) hours as needed for nausea or vomiting. 02/06/19   Janine Ores K, PA-C  oxyCODONE (ROXICODONE) 5 MG immediate release tablet Take 1  tablet (5 mg total) by mouth every 4 (four) hours as needed for severe pain. 02/06/19   Janine Ores K, PA-C  sennosides-docusate sodium (SENOKOT-S) 8.6-50 MG tablet Take 2 tablets by mouth daily. 02/06/19   Armida Sans, PA-C      Results for orders placed or performed during the hospital encounter of 10/28/20 (from the past 48 hour(s))  Urinalysis, Routine w reflex microscopic     Status: Abnormal   Collection Time: 10/28/20  7:29 PM  Result Value Ref Range   Color, Urine YELLOW YELLOW   APPearance HAZY (A) CLEAR   Specific Gravity, Urine 1.027 1.005 - 1.030   pH 5.0 5.0 - 8.0   Glucose, UA NEGATIVE NEGATIVE mg/dL   Hgb urine dipstick NEGATIVE NEGATIVE   Bilirubin Urine NEGATIVE NEGATIVE   Ketones, ur NEGATIVE NEGATIVE mg/dL   Protein, ur NEGATIVE NEGATIVE mg/dL   Nitrite NEGATIVE NEGATIVE   Leukocytes,Ua NEGATIVE NEGATIVE    Comment: Performed at [facility], [address] [place], [place] [address]  CBC with Differential     Status: Abnormal   Collection Time: 10/28/20  7:33 PM  Result Value Ref Range   WBC 14.0 (H) 4.0 - 10.5 K/uL   RBC 5.06 4.22 - 5.81 MIL/uL   Hemoglobin 15.6 13.0 - 17.0 g/dL   HCT 40.8 14.4 - 81.8 %  MCV 92.9 80.0 - 100.0 fL   MCH 30.8 26.0 - 34.0 pg   MCHC 33.2 30.0 - 36.0 g/dL   RDW 12.8 78.6 - 76.7 %   Platelets 237 150 - 400 K/uL   nRBC 0.0 0.0 - 0.2 %   Neutrophils Relative % 75 %   Neutro Abs 10.6 (H) 1.7 - 7.7 K/uL   Lymphocytes Relative 15 %   Lymphs Abs 2.0 0.7 - 4.0 K/uL   Monocytes Relative 7 %   Monocytes Absolute 0.9 0.1 - 1.0 K/uL   Eosinophils Relative 2 %   Eosinophils Absolute 0.3 0.0 - 0.5 K/uL   Basophils Relative 1 %   Basophils Absolute 0.1 0.0 - 0.1 K/uL   Immature Granulocytes 0 %   Abs Immature Granulocytes 0.06 0.00 - 0.07 K/uL    Comment: Performed at [facility], [address], [place], [place] [address]  Comprehensive metabolic panel     Status: Abnormal   Collection Time: 10/28/20  7:33 PM   Result Value Ref Range   Sodium 137 135 - 145 mmol/L   Potassium 3.5 3.5 - 5.1 mmol/L   Chloride 102 98 - 111 mmol/L   CO2 26 22 - 32 mmol/L   Glucose, Bld 105 (H) 70 - 99 mg/dL    Comment: Glucose reference range applies only to samples taken after fasting for at least 8 hours.   BUN 15 6 - 20 mg/dL   Creatinine, Ser 0.96 0.61 - 1.24 mg/dL   Calcium 9.2 8.9 - 28.3 mg/dL   Total Protein 6.8 6.5 - 8.1 g/dL   Albumin 3.7 3.5 - 5.0 g/dL   AST 20 15 - 41 U/L   ALT 14 0 - 44 U/L   Alkaline Phosphatase 74 38 - 126 U/L   Total Bilirubin 0.4 0.3 - 1.2 mg/dL   GFR, Estimated >66 >29 mL/min    Comment: (NOTE) Calculated using the CKD-EPI Creatinine Equation (2021)    Anion gap 9 5 - 15    Comment: Performed at [facility], [address] [place], [place] [address]  Lipase, blood     Status: None   Collection Time: 10/28/20  7:33 PM  Result Value Ref Range   Lipase 28 11 - 51 U/L    Comment: Performed at [facility], [address], [place], [place] [address]   US Abdomen Limited RUQ (LIVER/GB)  Result Date: 10/28/2020 CLINICAL DATA:  Right upper quadrant pain EXAM: ULTRASOUND ABDOMEN LIMITED RIGHT UPPER QUADRANT COMPARISON:  None. FINDINGS: Gallbladder: Multiple stones identified within the gallbladder measuring up to 1.9 cm. Gallbladder wall appears thickened measuring 4.7 cm. Positive sonographic Murphy's sign reported by the sonographer. Common bile duct: Diameter: 7.5 mm. Liver: Normal parenchymal echogenicity. Central right lobe of liver cyst measures 3.1 x 2.5 x 3.2 cm. Portal vein is patent on color Doppler imaging with normal direction of blood flow towards the liver. Other: None. IMPRESSION: 1. Gallstones and gallbladder wall thickening. Positive sonographic Murphy's sign. Imaging findings are concerning for acute cholecystitis. 2. Mild increase caliber of the CBD measuring 7.5 mm. Electronically Signed   By: Signa Kell M.D.   On: 10/28/2020 20:11    Review of  Systems  HENT:  Negative for ear discharge, ear pain, hearing loss and tinnitus.   Eyes:  Negative for photophobia and pain.  Respiratory:  Negative for cough and shortness of breath.   Cardiovascular:  Negative for chest pain.  Gastrointestinal:  Positive  for abdominal distention and abdominal pain. Negative for nausea and vomiting.  Genitourinary:  Negative for dysuria, flank pain, frequency and urgency.  Musculoskeletal:  Negative for back pain, myalgias and neck pain.  Neurological:  Negative for dizziness and headaches.  Hematological:  Does not bruise/bleed easily.  Psychiatric/Behavioral:  The patient is not nervous/anxious.    Blood pressure (!) 139/91, pulse 64, temperature 98 F (36.7 C), temperature source Oral, resp. rate 14, height 5\' 11"  (1.803 m), weight 104 kg, SpO2 99 %. Physical Exam  Constitutional:  WDWN in NAD, conversant, no obvious deformities; lying in bed comfortably Eyes:  Pupils equal, round; sclera anicteric; moist conjunctiva; no lid lag HENT:  Oral mucosa moist; good dentition  Neck:  No masses palpated, trachea midline; no thyromegaly Lungs:  CTA bilaterally; normal respiratory effort CV:  Regular rate and rhythm; no murmurs; extremities well-perfused with no edema Abd:  +bowel sounds, soft, tender in RUQ, no palpable organomegaly; no palpable hernias Musc:  Unable to assess gait; no apparent clubbing or cyanosis in extremities Lymphatic:  No palpable cervical or axillary lymphadenopathy Skin:  Warm, dry; no sign of jaundice Psychiatric - alert and oriented x 4; calm mood and affect  Assessment/Plan Acute calculus cholecystitis.    Recommend laparoscopic cholecystectomy.  The surgical procedure has been discussed with the patient.  Potential risks, benefits, alternative treatments, and expected outcomes have been explained.  All of the patient's questions at this time have been answered.  The likelihood of reaching the patient's treatment goal is good.   The patient understand the proposed surgical procedure and wishes to proceed.  Surgery will be performed by Dr. later today.  Dossie Der, MD 10/29/2020, 8:46 AM

## 2020-10-29 NOTE — ED Provider Notes (Signed)
[facility] EMERGENCY DEPARTMENT Provider Note   CSN: [id number] Arrival date & time: 10/28/20  1800     History Chief Complaint  Patient presents with   Abdominal Pain    [name] [name] is a 60 y.o. male presenting to the ED with a chief complaint of right upper quadrant pain.  Symptoms have been constant for the past few days and have gradually worsened.  He reports nausea but denies any vomiting, changes to bowel movements or urination.  Denies history of similar symptoms in the past.  Has not take any medications to help with his pain.  Never been told he had any issues with his gallbladder in the past.  He denies any chest pain, shortness of breath or cough, fever.  HPI     Past Medical History:  Diagnosis Date   Arthritis    NECK AND SHOULDERS    Patient Active Problem List   Diagnosis Date Noted   Arthritis of left knee 02/06/2019    Past Surgical History:  Procedure Laterality Date   EYE SURGERY     60 YRS OLD   PARTIAL KNEE ARTHROPLASTY Left 02/06/2019   Procedure: UNICOMPARTMENTAL KNEE;  Surgeon: Teryl Lucy, MD;  Location: [facility];  Service: Orthopedics;  Laterality: Left;       Family History  Problem Relation Age of Onset   Cancer Father        LUNG    Social History   Tobacco Use   Smoking status: Some Days    Packs/day: 1.00    Years: 20.00    Pack years: 20.00    Types: Cigarettes   Smokeless tobacco: Current    Types: Snuff   Tobacco comments:    10 cigarettes a week  Vaping Use   Vaping Use: Never used  Substance Use Topics   Alcohol use: Yes    Comment: COUPLE OF BEERS A WEEK   Drug use: Never    Home Medications Prior to Admission medications   Medication Sig Start Date End Date Taking? Authorizing Provider  aspirin EC 325 MG tablet Take 1 tablet (325 mg total) by mouth 2 (two) times daily. 02/06/19   Armida Sans, PA-C  baclofen (LIORESAL) 10 MG tablet Take 1 tablet (10 mg total) by mouth 3 (three)  times daily. As needed for muscle spasm 02/06/19   Janine Ores K, PA-C  Menthol-Methyl Salicylate (SALONPAS PAIN RELIEF PATCH EX) Apply 1 patch topically daily as needed (pain).    [provider]  Multiple Vitamin (MULTIVITAMIN WITH MINERALS) TABS tablet Take 1 tablet by mouth daily.    [provider]  ondansetron (ZOFRAN) 4 MG tablet Take 1 tablet (4 mg total) by mouth every 8 (eight) hours as needed for nausea or vomiting. 02/06/19   Janine Ores K, PA-C  oxyCODONE (ROXICODONE) 5 MG immediate release tablet Take 1 tablet (5 mg total) by mouth every 4 (four) hours as needed for severe pain. 02/06/19   Janine Ores K, PA-C  sennosides-docusate sodium (SENOKOT-S) 8.6-50 MG tablet Take 2 tablets by mouth daily. 02/06/19   Armida Sans, PA-C    Allergies    Coconut oil  Review of Systems   Review of Systems  Constitutional:  Negative for appetite change.  HENT:  Negative for ear pain, rhinorrhea and sneezing.   Eyes:  Negative for photophobia and visual disturbance.  Respiratory:  Negative for chest tightness and wheezing.   Cardiovascular:  Negative for palpitations.  Genitourinary:  Negative for urgency.  Musculoskeletal:  Negative for myalgias.  Skin:  Negative for rash.  Neurological:  Negative for dizziness, weakness and light-headedness.   Physical Exam Updated Vital Signs BP (!) 139/91 (BP Location: Left Arm)   Pulse 64   Temp 98 F (36.7 C) (Oral)   Resp 14   Ht 5\' 11"  (1.803 m)   Wt 104 kg   SpO2 99%   BMI 31.98 kg/m   Physical Exam Vitals and nursing note reviewed.  Constitutional:      General: He is not in acute distress.    Appearance: He is well-developed.  HENT:     Head: Normocephalic and atraumatic.     Nose: Nose normal.  Eyes:     General: No scleral icterus.       Left eye: No discharge.     Conjunctiva/sclera: Conjunctivae normal.  Cardiovascular:     Rate and Rhythm: Normal rate and regular rhythm.     Heart sounds:  Normal heart sounds. No murmur heard.   No friction rub. No gallop.  Pulmonary:     Effort: Pulmonary effort is normal. No respiratory distress.     Breath sounds: Normal breath sounds.  Abdominal:     General: Bowel sounds are normal. There is no distension.     Palpations: Abdomen is soft.     Tenderness: There is abdominal tenderness in the right upper quadrant. There is no guarding.  Musculoskeletal:        General: Normal range of motion.     Cervical back: Normal range of motion and neck supple.  Skin:    General: Skin is warm and dry.     Findings: No rash.  Neurological:     Mental Status: He is alert.     Motor: No abnormal muscle tone.     Coordination: Coordination normal.    ED Results / Procedures / Treatments   Labs (all labs ordered are listed, but only abnormal results are displayed) Labs Reviewed  CBC WITH DIFFERENTIAL/PLATELET - Abnormal; Notable for the following components:      Result Value   WBC 14.0 (*)    Neutro Abs 10.6 (*)    All other components within normal limits  COMPREHENSIVE METABOLIC PANEL - Abnormal; Notable for the following components:   Glucose, Bld 105 (*)    All other components within normal limits  URINALYSIS, ROUTINE W REFLEX MICROSCOPIC - Abnormal; Notable for the following components:   APPearance HAZY (*)    All other components within normal limits  RESP PANEL BY RT-PCR (FLU A&B, COVID) ARPGX2  LIPASE, BLOOD    EKG None  Radiology Abdomen Limited RUQ (LIVER/GB)  Result Date: 10/28/2020 CLINICAL DATA:  Right upper quadrant pain EXAM: ULTRASOUND ABDOMEN LIMITED RIGHT UPPER QUADRANT COMPARISON:  None. FINDINGS: Gallbladder: Multiple stones identified within the gallbladder measuring up to 1.9 cm. Gallbladder wall appears thickened measuring 4.7 cm. Positive sonographic Murphy's sign reported by the sonographer. Common bile duct: Diameter: 7.5 mm. Liver: Normal parenchymal echogenicity. Central right lobe of liver cyst  measures 3.1 x 2.5 x 3.2 cm. Portal vein is patent on color Doppler imaging with normal direction of blood flow towards the liver. Other: None. IMPRESSION: 1. Gallstones and gallbladder wall thickening. Positive sonographic Murphy's sign. Imaging findings are concerning for acute cholecystitis. 2. Mild increase caliber of the CBD measuring 7.5 mm. Electronically Signed   By: 12/28/2020 M.D.   On: 10/28/2020 20:11    Procedures Procedures  Medications Ordered in ED Medications  famotidine (PEPCID) IVPB 20 mg in NS 100 mL IVPB (has no administration in time range)  cefTRIAXone (ROCEPHIN) 2 g in sodium chloride 0.9 % 100 mL IVPB (has no administration in time range)  sodium chloride 0.9 % bolus 1,000 mL (1,000 mLs Intravenous New Bag/Given 10/29/20 0839)  HYDROmorphone (DILAUDID) injection 1 mg (1 mg Intravenous Given 10/29/20 0833)  ondansetron (ZOFRAN) injection 4 mg (4 mg Intravenous Given 10/29/20 0831)    ED Course  I have reviewed the triage vital signs and the nursing notes.  Pertinent labs & imaging results that were available during my care of the patient were reviewed by me and considered in my medical decision making (see chart for details).  Clinical Course as of 10/29/20 0847  Wed Oct 29, 2020  0810 WBC(!): 14.0 [HK]  0810 AST: 20 [HK]  0810 ALT: 14 [HK]  0810 Total Bilirubin: 0.4 [HK]  0833 General surgery to see patient. [HK]    Clinical Course User Index [HK] Dietrich Pates, PA-C   MDM Rules/Calculators/A&P                           60 year old male presenting to the ED for right upper quadrant pain.  Constant pain for the past few days that has gradually worsened.  Denies any vomiting, fever, chest pain, changes to bowel movements or urination.  On exam there is tenderness to the right upper quadrant patient appears uncomfortable.  His vital signs are within normal limits.  Work-up significant for leukocytosis of 14 but unremarkable LFTs.  Normal lipase here as well.   Ultrasound done in triage shows findings concerning for acute cholecystitis with multiple gallstones and gallbladder wall thickening.  Will attempt to control symptoms here, give IV fluids and consult general surgery. Have ordered antibiotics.  Surgery to take patient to the OR for management. Appreciate their input.   Portions of this note were generated with Scientist, clinical (histocompatibility and immunogenetics). Dictation errors may occur despite best attempts at proofreading.  Final Clinical Impression(s) / ED Diagnoses Final diagnoses:  Right upper quadrant pain  Cholecystitis    Rx / DC Orders ED Discharge Orders     None        Dietrich Pates, PA-C 10/29/20 0847    Virgina Norfolk, DO 10/29/20 1430

## 2020-10-29 NOTE — Anesthesia Preprocedure Evaluation (Addendum)
Anesthesia Evaluation  Patient identified by MRN, date of birth, ID band Patient awake    Reviewed: Allergy & Precautions, NPO status , Patient's Chart, lab work & pertinent test results  Airway Mallampati: II  TM Distance: >3 FB Neck ROM: Full    Dental  (+) Poor Dentition   Pulmonary Current Smoker,    Pulmonary exam normal breath sounds clear to auscultation       Cardiovascular negative cardio ROS Normal cardiovascular exam Rhythm:Regular Rate:Normal     Neuro/Psych negative neurological ROS  negative psych ROS   GI/Hepatic negative GI ROS, Neg liver ROS,   Endo/Other  negative endocrine ROS  Renal/GU negative Renal ROS  negative genitourinary   Musculoskeletal  (+) Arthritis ,   Abdominal   Peds negative pediatric ROS (+)  Hematology negative hematology ROS (+)   Anesthesia Other Findings   Reproductive/Obstetrics negative OB ROS                            Anesthesia Physical Anesthesia Plan  ASA: 2 and emergent  Anesthesia Plan: General   Post-op Pain Management:    Induction: Intravenous  PONV Risk Score and Plan: Treatment may vary due to age or medical condition, Ondansetron and Dexamethasone  Airway Management Planned: Oral ETT  Additional Equipment: None  Intra-op Plan:   Post-operative Plan: Extubation in OR  Informed Consent: I have reviewed the patients History and Physical, chart, labs and discussed the procedure including the risks, benefits and alternatives for the proposed anesthesia with the patient or authorized representative who has indicated his/her understanding and acceptance.     Dental advisory given  Plan Discussed with: CRNA, Anesthesiologist and Surgeon  Anesthesia Plan Comments:         Anesthesia Quick Evaluation

## 2020-10-29 NOTE — ED Notes (Signed)
Family is taking all pt belongings.  Consent signed at bedside.

## 2020-10-29 NOTE — Transfer of Care (Signed)
Immediate Anesthesia Transfer of Care Note  Patient: [name] [name]  Procedure(s) Performed: LAPAROSCOPIC CHOLECYSTECTOMY  Patient Location: PACU  Anesthesia Type:General  Level of Consciousness: awake and alert   Airway & Oxygen Therapy: Patient Spontanous Breathing and Patient connected to face mask oxygen  Post-op Assessment: Report given to RN and Post -op Vital signs reviewed and stable  Post vital signs: Reviewed and stable  Last Vitals:  Vitals Value Taken Time  BP 168/106 10/29/20 1625  Temp    Pulse 90 10/29/20 1625  Resp 21 10/29/20 1625  SpO2 99 % 10/29/20 1625  Vitals shown include unvalidated device data.  Last Pain:  Vitals:   10/29/20 1407  TempSrc: Oral  PainSc:       Patients Stated Pain Goal: 2 (10/29/20 3875)  Complications: No notable events documented.

## 2020-10-29 NOTE — Anesthesia Procedure Notes (Signed)
Procedure Name: Intubation Date/Time: 10/29/2020 3:11 PM Performed by: Inda Coke, CRNA Pre-anesthesia Checklist: Patient identified, Emergency Drugs available, Suction available and Patient being monitored Patient Re-evaluated:Patient Re-evaluated prior to induction Oxygen Delivery Method: Circle System Utilized Preoxygenation: Pre-oxygenation with 100% oxygen Induction Type: IV induction Ventilation: Mask ventilation without difficulty and Oral airway inserted - appropriate to patient size Laryngoscope Size: Mac and 4 Grade View: Grade I Tube type: Oral Tube size: 7.5 mm Number of attempts: 1 Airway Equipment and Method: Stylet and Oral airway Placement Confirmation: ETT inserted through vocal cords under direct vision, positive ETCO2 and breath sounds checked- equal and bilateral Secured at: 22 cm Tube secured with: Tape Dental Injury: Teeth and Oropharynx as per pre-operative assessment

## 2020-10-30 ENCOUNTER — Encounter (HOSPITAL_COMMUNITY): Payer: Self-pay | Admitting: Surgery

## 2020-10-30 NOTE — Anesthesia Postprocedure Evaluation (Signed)
Anesthesia Post Note  Patient: [name] [name]  Procedure(s) Performed: LAPAROSCOPIC CHOLECYSTECTOMY     Patient location during evaluation: PACU Anesthesia Type: General Level of consciousness: awake and alert Pain management: pain level controlled Vital Signs Assessment: post-procedure vital signs reviewed and stable Respiratory status: spontaneous breathing, nonlabored ventilation and respiratory function stable Cardiovascular status: blood pressure returned to baseline and stable Postop Assessment: no apparent nausea or vomiting Anesthetic complications: no   No notable events documented.  Last Vitals:  Vitals:   10/29/20 1925 10/29/20 1955  BP: 132/90 (!) 142/87  Pulse: 71 72  Resp: 20 (!) 22  Temp:    SpO2: 93% 95%    Last Pain:  Vitals:   10/29/20 1955  TempSrc:   PainSc: 0-No pain                 Mellody Dance

## 2020-10-31 LAB — SURGICAL PATHOLOGY

## 2022-02-11 IMAGING — US US ABDOMEN LIMITED
1 series · 14 of 25 positions shown · non-contrast
Comparison: None.

CLINICAL DATA: Right upper quadrant pain

EXAM:
ULTRASOUND ABDOMEN LIMITED RIGHT UPPER QUADRANT

[Series 1: us abdomen limited ruq (liver/gb) · 14 of 48 slices shown]
[im 1/48]
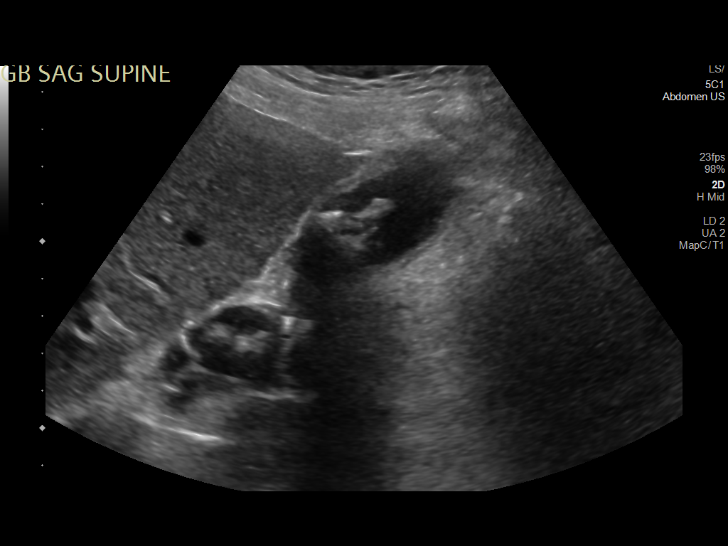
[im 4/48]
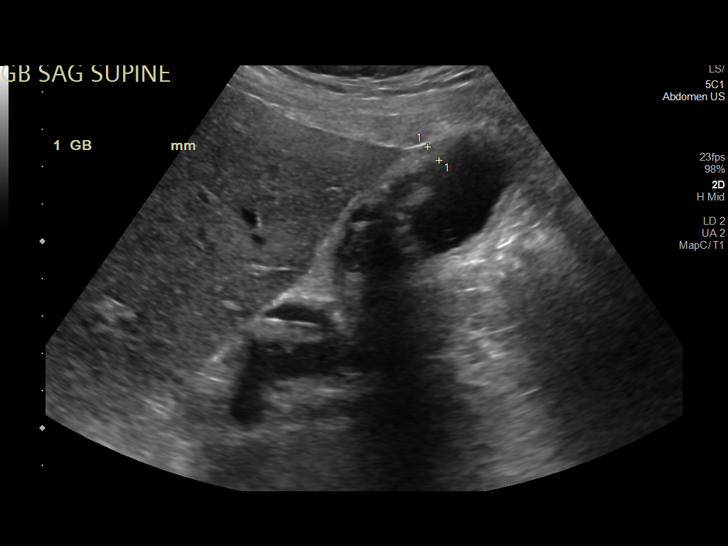
[im 8/48]
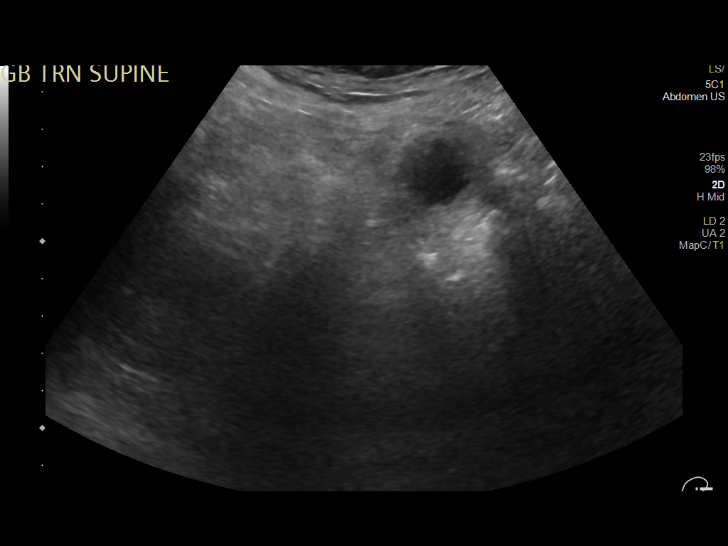
[im 12/48]
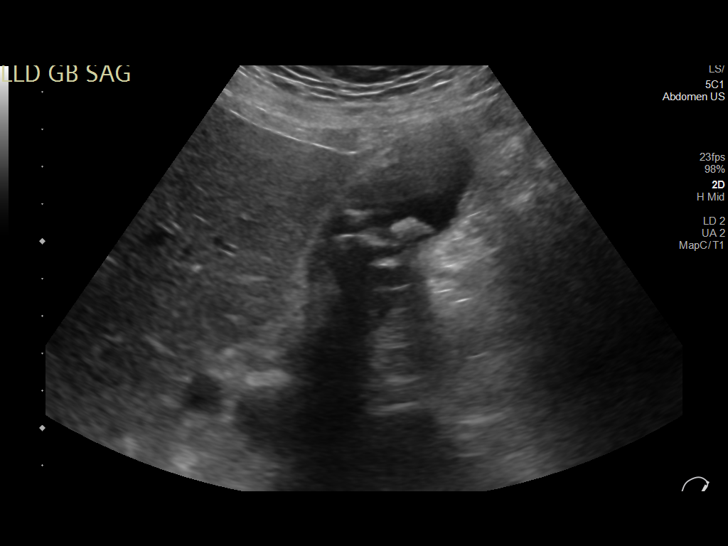
[im 16/48]
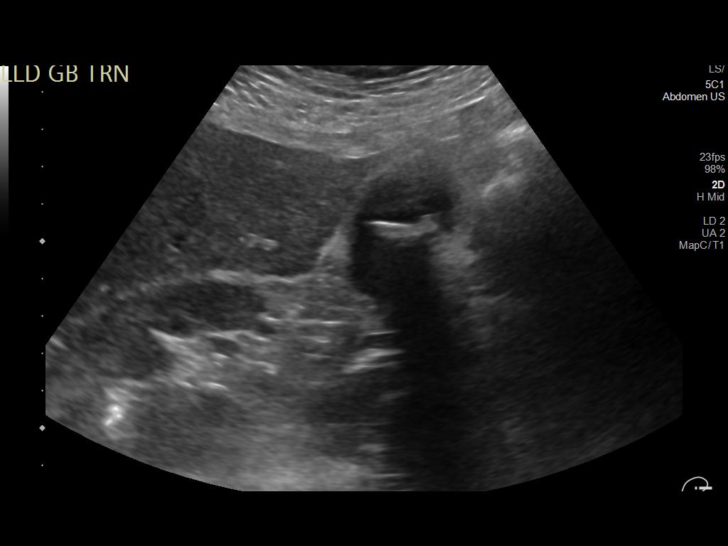
[im 18/48]
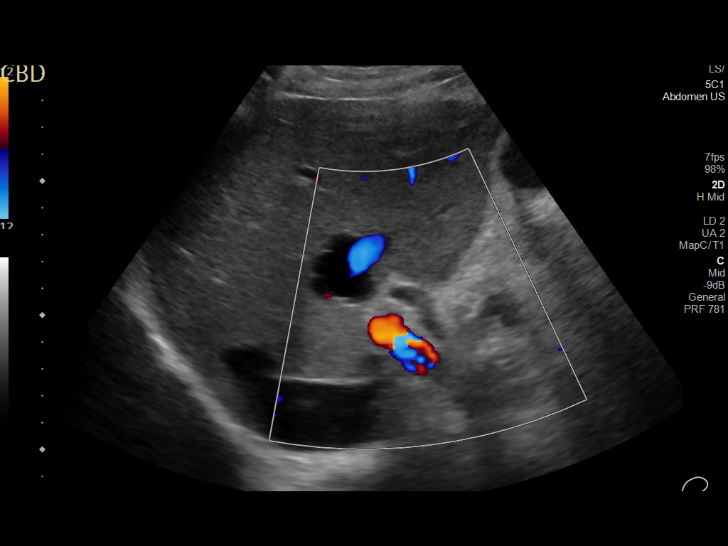
[im 22/48]
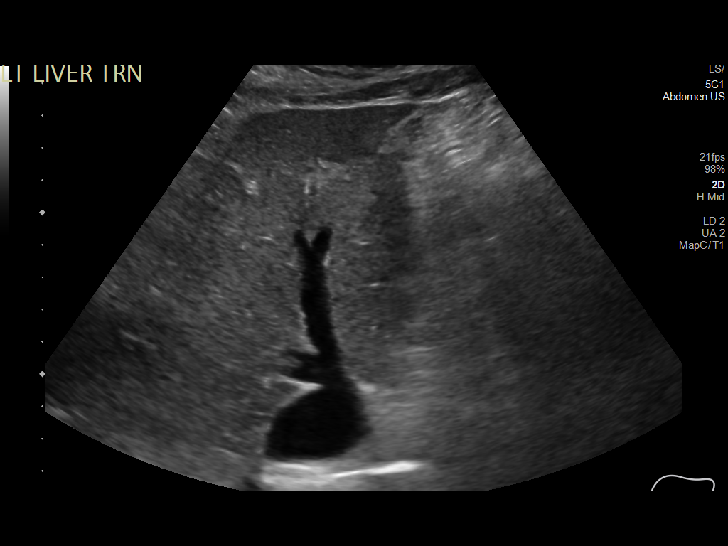
[im 26/48]
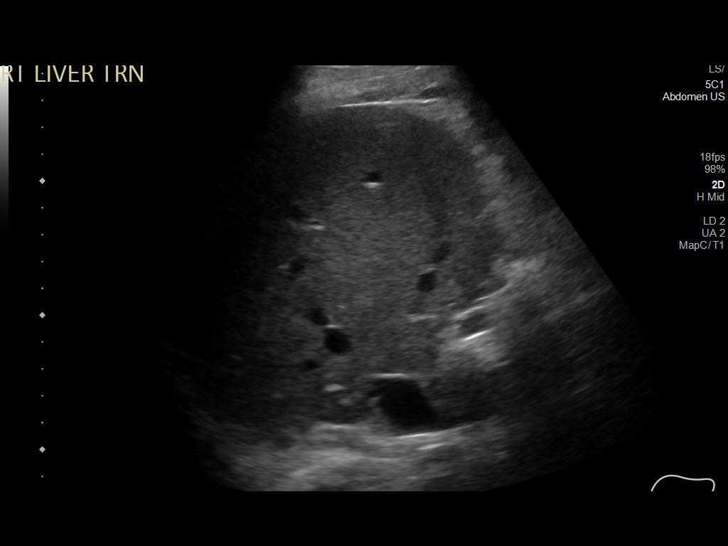
[im 30/48]
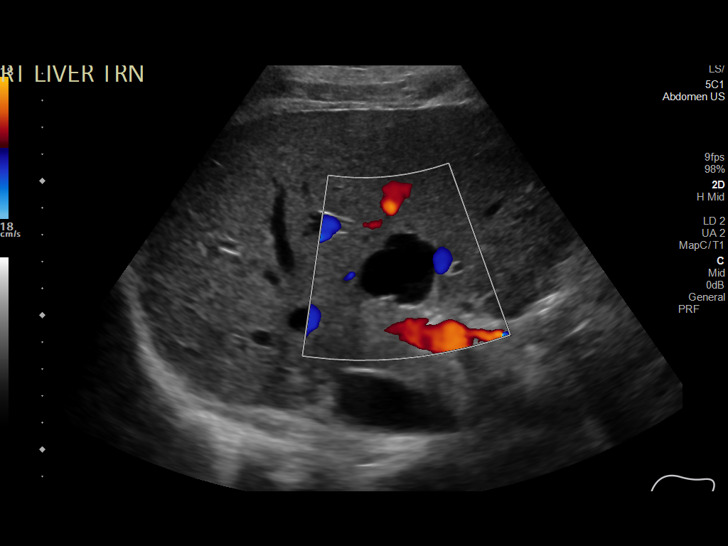
[im 32/48]
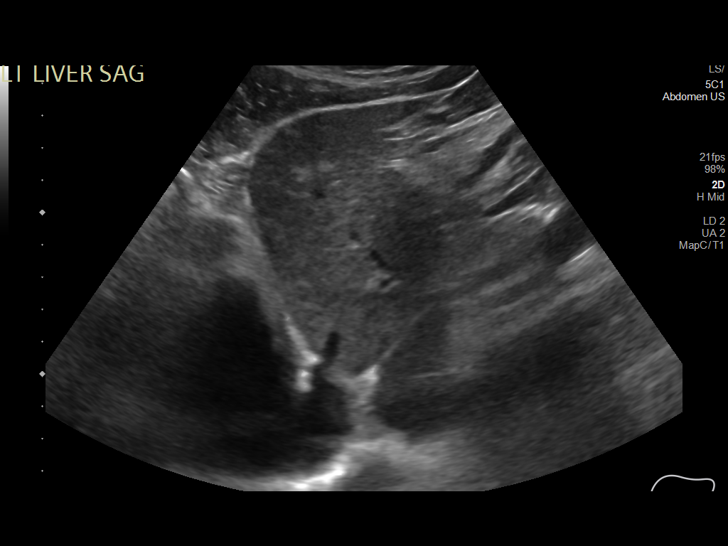
[im 36/48]
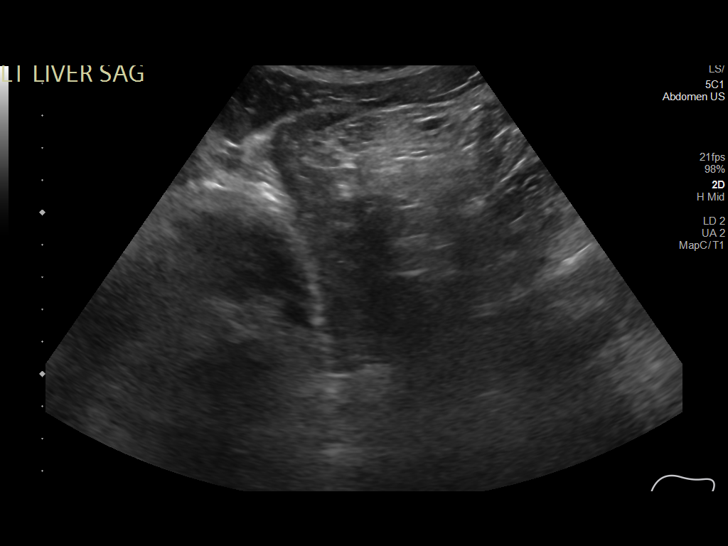
[im 40/48]
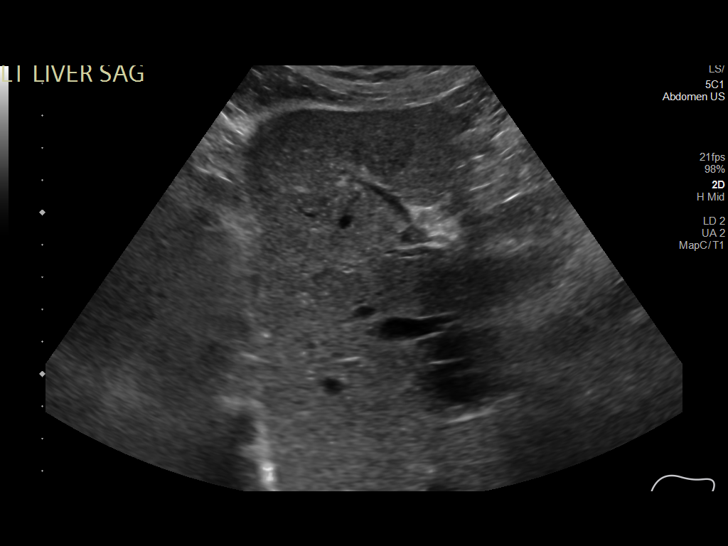
[im 44/48]
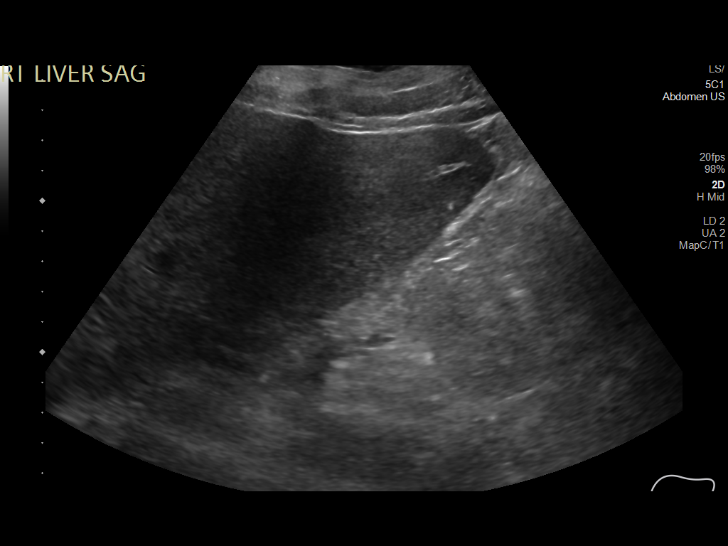
[im 48/48]
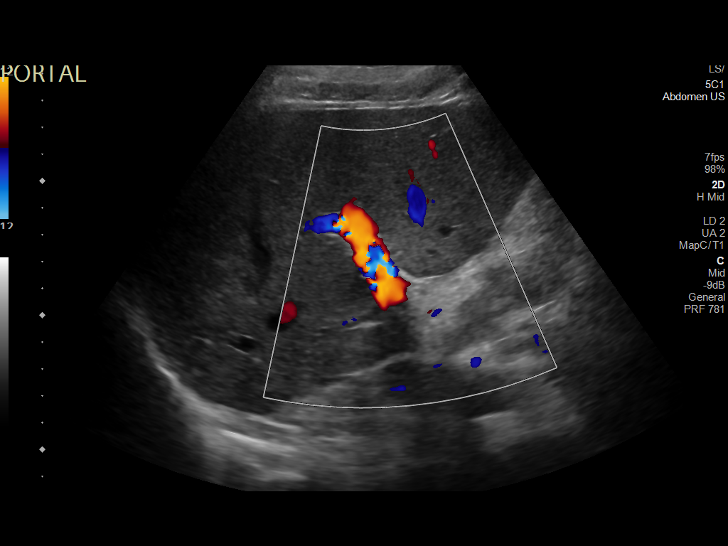

[14 of 25 positions shown; findings below may reference images not displayed]

FINDINGS: Gallbladder:

Multiple stones identified within the gallbladder measuring up to
1.9 cm. Gallbladder wall appears thickened measuring 4.7 cm.
Positive sonographic Murphy's sign reported by the sonographer.

Common bile duct:

Diameter: 7.5 mm.

Liver:

Normal parenchymal echogenicity. Central right lobe of liver cyst
measures 3.1 x 2.5 x 3.2 cm. Portal vein is patent on color Doppler
imaging with normal direction of blood flow towards the liver.

Other: None.
IMPRESSION: 1. Gallstones and gallbladder wall thickening. Positive sonographic
Murphy's sign. Imaging findings are concerning for acute
cholecystitis.
2. Mild increase caliber of the CBD measuring 7.5 mm.
